# Patient Record
Sex: Male | Born: 1955 | Race: White | Hispanic: No | Smoking: Current every day smoker
Health system: Southern US, Community
[De-identification: ages and names within clinical notes are randomized; demographics above are authoritative.]

## PROBLEM LIST (undated history)

## (undated) DIAGNOSIS — K219 Gastro-esophageal reflux disease without esophagitis: Secondary | ICD-10-CM

## (undated) DIAGNOSIS — M5030 Other cervical disc degeneration, unspecified cervical region: Secondary | ICD-10-CM

## (undated) DIAGNOSIS — Z8711 Personal history of peptic ulcer disease: Secondary | ICD-10-CM

## (undated) DIAGNOSIS — K279 Peptic ulcer, site unspecified, unspecified as acute or chronic, without hemorrhage or perforation: Secondary | ICD-10-CM

## (undated) DIAGNOSIS — I219 Acute myocardial infarction, unspecified: Secondary | ICD-10-CM

## (undated) DIAGNOSIS — M1990 Unspecified osteoarthritis, unspecified site: Secondary | ICD-10-CM

## (undated) DIAGNOSIS — G8929 Other chronic pain: Secondary | ICD-10-CM

## (undated) DIAGNOSIS — Z8719 Personal history of other diseases of the digestive system: Secondary | ICD-10-CM

## (undated) DIAGNOSIS — J45909 Unspecified asthma, uncomplicated: Secondary | ICD-10-CM

## (undated) DIAGNOSIS — F419 Anxiety disorder, unspecified: Secondary | ICD-10-CM

## (undated) DIAGNOSIS — M549 Dorsalgia, unspecified: Secondary | ICD-10-CM

## (undated) HISTORY — PX: CHOLECYSTECTOMY: SHX55

## (undated) HISTORY — PX: ABDOMINAL SURGERY: SHX537

## (undated) HISTORY — PX: LEG SURGERY: SHX1003

---

## 2000-02-05 ENCOUNTER — Inpatient Hospital Stay (HOSPITAL_COMMUNITY): Admission: EM | Admit: 2000-02-05 | Discharge: 2000-02-05 | Payer: Self-pay | Admitting: *Deleted

## 2001-07-29 ENCOUNTER — Ambulatory Visit (HOSPITAL_COMMUNITY): Admission: RE | Admit: 2001-07-29 | Discharge: 2001-07-29 | Payer: Self-pay | Admitting: Cardiology

## 2002-04-20 ENCOUNTER — Ambulatory Visit (HOSPITAL_COMMUNITY): Admission: RE | Admit: 2002-04-20 | Discharge: 2002-04-20 | Payer: Self-pay | Admitting: Pulmonary Disease

## 2004-06-30 DIAGNOSIS — I219 Acute myocardial infarction, unspecified: Secondary | ICD-10-CM

## 2004-06-30 HISTORY — DX: Acute myocardial infarction, unspecified: I21.9

## 2004-11-27 ENCOUNTER — Ambulatory Visit: Payer: Self-pay | Admitting: *Deleted

## 2005-05-23 ENCOUNTER — Ambulatory Visit (HOSPITAL_COMMUNITY): Admission: RE | Admit: 2005-05-23 | Discharge: 2005-05-23 | Payer: Self-pay | Admitting: General Surgery

## 2005-10-30 ENCOUNTER — Ambulatory Visit (HOSPITAL_COMMUNITY): Admission: RE | Admit: 2005-10-30 | Discharge: 2005-10-30 | Payer: Self-pay | Admitting: General Surgery

## 2005-10-30 ENCOUNTER — Encounter (HOSPITAL_COMMUNITY): Admission: RE | Admit: 2005-10-30 | Discharge: 2005-11-29 | Payer: Self-pay | Admitting: General Surgery

## 2006-07-10 ENCOUNTER — Ambulatory Visit (HOSPITAL_COMMUNITY): Admission: RE | Admit: 2006-07-10 | Discharge: 2006-07-10 | Payer: Self-pay | Admitting: Family Medicine

## 2006-09-02 ENCOUNTER — Encounter (HOSPITAL_COMMUNITY)
Admission: RE | Admit: 2006-09-02 | Discharge: 2006-10-02 | Payer: Self-pay | Admitting: Physical Medicine and Rehabilitation

## 2007-12-04 ENCOUNTER — Emergency Department (HOSPITAL_COMMUNITY): Admission: EM | Admit: 2007-12-04 | Discharge: 2007-12-04 | Payer: Self-pay | Admitting: Emergency Medicine

## 2008-01-19 ENCOUNTER — Emergency Department (HOSPITAL_COMMUNITY): Admission: EM | Admit: 2008-01-19 | Discharge: 2008-01-19 | Payer: Self-pay | Admitting: Emergency Medicine

## 2008-03-15 ENCOUNTER — Ambulatory Visit (HOSPITAL_COMMUNITY): Admission: RE | Admit: 2008-03-15 | Discharge: 2008-03-15 | Payer: Self-pay | Admitting: Preventative Medicine

## 2008-08-21 ENCOUNTER — Encounter: Payer: Self-pay | Admitting: Orthopedic Surgery

## 2008-08-21 ENCOUNTER — Encounter: Admission: RE | Admit: 2008-08-21 | Discharge: 2008-08-21 | Payer: Self-pay | Admitting: Neurosurgery

## 2009-08-03 ENCOUNTER — Encounter: Payer: Self-pay | Admitting: Orthopedic Surgery

## 2009-08-04 ENCOUNTER — Ambulatory Visit (HOSPITAL_COMMUNITY): Admission: RE | Admit: 2009-08-04 | Discharge: 2009-08-04 | Payer: Self-pay | Admitting: Family Medicine

## 2009-09-18 ENCOUNTER — Ambulatory Visit: Payer: Self-pay | Admitting: Orthopedic Surgery

## 2009-09-18 DIAGNOSIS — M79609 Pain in unspecified limb: Secondary | ICD-10-CM | POA: Insufficient documentation

## 2009-09-18 DIAGNOSIS — M6530 Trigger finger, unspecified finger: Secondary | ICD-10-CM | POA: Insufficient documentation

## 2009-09-25 ENCOUNTER — Ambulatory Visit: Payer: Self-pay | Admitting: Orthopedic Surgery

## 2009-09-27 ENCOUNTER — Telehealth: Payer: Self-pay | Admitting: Orthopedic Surgery

## 2009-09-28 ENCOUNTER — Ambulatory Visit: Payer: Self-pay | Admitting: Orthopedic Surgery

## 2009-09-28 ENCOUNTER — Ambulatory Visit (HOSPITAL_COMMUNITY): Admission: RE | Admit: 2009-09-28 | Discharge: 2009-09-28 | Payer: Self-pay | Admitting: Orthopedic Surgery

## 2009-10-02 ENCOUNTER — Ambulatory Visit: Payer: Self-pay | Admitting: Orthopedic Surgery

## 2009-10-11 ENCOUNTER — Ambulatory Visit: Payer: Self-pay | Admitting: Orthopedic Surgery

## 2010-07-30 NOTE — Assessment & Plan Note (Signed)
Summary: 1 WK RE-CK LT HAND/MEDICARE,MEDICAID/CAF   Visit Type:  Follow-up Referring Provider:  Dr. Janna Arch Primary Provider:  Dr. Janna Arch  CC:  left thumb trigger.  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  The naproxen it didn't help and went to the ER x-rays were notable for arthritis of the small joints of the hand including the IP joints presents now for evaluation his pain is 8/10 he says this worsened over 3 months we injected his thumb. It didn't help at all. He still having a lot of pain and tenderness and catching.  Xrays Left hand 08/04/09.  Meds: Omeprazole, Xanax, Ventolin, Naproxen.   Allergies: 1)  ! Jonne Ply  Past History:  Past Medical History: Last updated: 09/18/2009 ulcers ddd anxiety  Past Surgical History: Last updated: 09/18/2009 ulcres gallbladder appendix infectious arthritis  Family History: Last updated: 09/18/2009 FH of Cancer:  Family History of Diabetes Family History Coronary Heart Disease male < 89 Family History of Arthritis Hx, family, chronic respiratory condition Hx, family, asthma  Social History: Last updated: 09/18/2009 Patient is divorced.  disabled 1/2 ppd cigs 3 beers a day 4 cups of coffee daily  Review of Systems       weight loss, fatigue, blurred vision, headache, denies chest pain, palpitations, shortness of breath, wheezing cough, snoring, heartburn, nausea, vomiting, diarrhea,  frequency, urgency, muscle pain, numbness, tingling, dizziness, nervousness anxiety.  other systems are negative  Physical Exam  Additional Exam:  VS reviewed are stable  GENERAL: Appearance is normal   CDV: normal pulse and temperature   Skin: was normal   Neuro: sensation was normal   MSK  LEFT hand  Inspection swelling and flexion posture LEFT thumb with IP joint in flexion.  Tenderness over A1 pulley.  Passive range of motion I  was able to pop the digit back into extension it was painful his flexion power is normal there is no joint laxity    Impression & Recommendations:  Problem # 1:  THUMB PAIN (ICD-729.5) Assessment Unchanged  Orders: Est. Patient Level IV (60630)  Problem # 2:  TRIGGER FINGER, LEFT THUMB (ICD-727.03) Assessment: Unchanged  Orders: Est. Patient Level IV (16010)  Patient Instructions: 1)  Left thumb trigger release and nodule excision  2)  Set up post op appointment for Tues

## 2010-07-30 NOTE — Progress Notes (Signed)
Summary: No pre-cert required out-patient surgery  Phone Note Outgoing Call   Call placed to: Insurer Summary of Call: No pre-cert required for out-patient procedure scheduled 09/28/09 at [facility] (Medicare primary, Medicaid secondary) Initial call taken by: Cammie Sickle,  September 27, 2009 7:17 PM

## 2010-07-30 NOTE — Letter (Signed)
Summary: History form  History form   Imported By: Jacklynn Ganong 09/25/2009 14:16:18  _____________________________________________________________________  External Attachment:    Type:   Image     Comment:   External Document

## 2010-07-30 NOTE — Assessment & Plan Note (Signed)
Summary: POST OP 1/LT THUMB/SURG 09/28/09/MEDICARE,MEDICAID/CAF   Visit Type:  Follow-up Referring Provider:  Dr. Janna Arch Primary Provider:  Dr. Janna Arch  CC:  post op 1 left thumb trigger.  History of Present Illness: I saw Vincent Hudson in the office today for a followup visit.  [name] a 55 years old man with the complaint of:  DOS 09/28/09 left thumb  Procedure Release of left thumb trigger finger.  Medication Norco 5 only as needed  Doing good, can move finger better, no locking.  Subjectives post op 1 check incision.  POD 4.  incision looks good. His flexion, extension, is normal.  Take stitches out next visit     Allergies: 1)  ! Asa   Impression & Recommendations:  Problem # 1:  AFTERCARE FOLLOW SURGERY SKIN&SUBCUT TISSUE NEC (ICD-V58.77) Assessment Comment Only  stable   Orders: Post-Op Check (56213)  Patient Instructions: 1)  Come back 10/11/09 for suture remval post op 2

## 2010-07-30 NOTE — Assessment & Plan Note (Signed)
Summary: left hand/finger pain xr ap feb'11/med/medicaid/bsf   Vital Signs:  Patient profile:   55 year old male Height:      70 inches Weight:      186 pounds Pulse rate:   64 / minute Resp:     16 per minute  Vitals Entered By: Fuller Canada MD (September 18, 2009 1:10 PM)  Visit Type:  Initial Consult Referring Provider:  Dr. Janna Arch Primary Provider:  Dr. Janna Arch  CC:  left hand.  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  The naproxen it didn't help and went to the ER x-rays were notable for arthritis of the small joints of the hand including the IP joints presents now for evaluation his pain is 8/10 he says this worsened over 3 months  Xrays Left hand 08/04/09.  Meds: Omeprazole, Xanax, Ventolin, Naproxen.   Allergies (verified): 1)  ! Jonne Ply  Past History:  Past Medical History: ulcers ddd anxiety  Past Surgical History: ulcres gallbladder appendix infectious arthritis  Family History: FH of Cancer:  Family History of Diabetes Family History Coronary Heart Disease male < 42 Family History of Arthritis Hx, family, chronic respiratory condition Hx, family, asthma  Social History: Patient is divorced.  disabled 1/2 ppd cigs 3 beers a day 4 cups of coffee daily  Review of Systems Constitutional:  Complains of weight loss and fatigue; denies weight gain, fever, and chills. Cardiovascular:  Complains of chest pain and palpitations; denies fainting and murmurs. Respiratory:  Complains of short of breath, wheezing, couch, and snoring; denies tightness, pain on inspiration, and snoring . Gastrointestinal:  Complains of heartburn, nausea, vomiting, diarrhea, and blood in your stools; denies constipation. Genitourinary:  Complains of frequency and urgency; denies difficulty urinating, painful urination, flank pain, and bleeding in urine. Neurologic:  Complains of  numbness, tingling, and dizziness; denies unsteady gait, tremors, and seizure. Musculoskeletal:  Complains of joint pain, swelling, stiffness, and muscle pain; denies instability, redness, and heat. Endocrine:  Denies excessive thirst, exessive urination, and heat or cold intolerance. Psychiatric:  Complains of nervousness and anxiety; denies depression and hallucinations. Skin:  Denies changes in the skin, poor healing, rash, itching, and redness. HEENT:  Complains of blurred or double vision and watering; denies eye pain and redness; headache and ears ring. Immunology:  Denies seasonal allergies, sinus problems, and allergic to bee stings. Hemoatologic:  Denies easy bleeding and brusing.  Physical Exam  Additional Exam:  VS reviewed are stable  GENERAL: Appearance is normal   CDV: normal pulse and temperature   Skin: was normal   Neuro: sensation was normal   MSK  LEFT hand  Inspection swelling and flexion posture LEFT thumb with IP joint in flexion.  Tenderness over A1 pulley.  Passive range of motion I was able to pop the digit back into extension it was painful his flexion power is normal there is no joint laxity    Impression & Recommendations:  Problem # 1:  TRIGGER FINGER, LEFT THUMB (ICD-727.03) Assessment New  injected LEFT trigger thumb Verbal consent was obtained: The finger was prepped with ethyl chloride and injected with 1:1 injection of .25% sensorcaine, 1cc  and 40 mg of depomedrol, 1cc. There were no complications.  I also applied a splint this seems to be a very severe case recommend followup in a week or a splint for a week if not better recommend surgery  Orders: New Patient Level III (16109) Joint Aspirate /  Injection, Small (82993) Depo- Medrol 40mg  (J1030)  Patient Instructions: 1)  You have received an injection of cortisone today. You may experience increased pain at the injection site. Apply ice pack to the area for 20 minutes every 2 hours and  take 2 xtra strength tylenol every 8 hours. This increased pain will usually resolve in 24 hours. The injection will take effect in 3-10 days.  2)  return in 1 week  Appended Document: left hand/finger pain xr ap feb'11/med/medicaid/bsf I reviewed the x-ray from the hospital and I also reviewed the report has arthritis in his hands.

## 2010-07-30 NOTE — Assessment & Plan Note (Signed)
Summary: POST OP 2/SUTURE REM/SURG 09/28/09/MEDICARE,MEDIC/CAF   Visit Type:  Follow-up Referring Provider:  Dr. Janna Arch Primary Provider:  Dr. Janna Arch  CC:  POST OP TRIGGER RELEASE.  History of Present Illness: I saw Vincent Hudson in the office today for a followup visit.  [name] a 55 years old man with the complaint of:  DOS 09/28/09 left thumb  POD 13  Procedure Release of left thumb trigger finger.  Medication Norco 5 only as needed  Doing good, can move finger better, no locking.  Today  stitch removal.  Doing well.  normal wound   d/c    Allergies: 1)  ! Asa   Other Orders: Post-Op Check [phone])  Patient Instructions: 1)  Please schedule a follow-up appointment as needed.

## 2010-09-22 LAB — HEMOGLOBIN AND HEMATOCRIT, BLOOD: HCT: 42.8 % (ref 39.0–52.0)

## 2010-09-22 LAB — BASIC METABOLIC PANEL
BUN: 10 mg/dL (ref 6–23)
CO2: 30 mEq/L (ref 19–32)
Creatinine, Ser: 1.01 mg/dL (ref 0.4–1.5)
GFR calc Af Amer: >60 mL/min (ref >60)
Glucose, Bld: 113 mg/dL — ABNORMAL HIGH (ref 70–99)
Potassium: 4.9 mEq/L (ref 3.5–5.1)

## 2010-11-04 ENCOUNTER — Other Ambulatory Visit (HOSPITAL_COMMUNITY)
Admission: RE | Admit: 2010-11-04 | Discharge: 2010-11-04 | Disposition: A | Discharge status: Home / Self Care | Payer: Medicare Other | Source: Ambulatory Visit | Attending: General Surgery | Admitting: General Surgery

## 2010-11-04 ENCOUNTER — Other Ambulatory Visit: Payer: Self-pay | Admitting: General Surgery

## 2010-11-04 DIAGNOSIS — T63481A Toxic effect of venom of other arthropod, accidental (unintentional), initial encounter: Secondary | ICD-10-CM | POA: Insufficient documentation

## 2010-11-04 DIAGNOSIS — L980 Pyogenic granuloma: Secondary | ICD-10-CM | POA: Insufficient documentation

## 2010-11-04 DIAGNOSIS — L989 Disorder of the skin and subcutaneous tissue, unspecified: Secondary | ICD-10-CM | POA: Insufficient documentation

## 2010-11-20 NOTE — Op Note (Signed)
  NAMESHANN, Vincent             ACCOUNT NO.:  [id number]  MEDICAL RECORD NO.:  [id number]           PATIENT TYPE:  LOCATION:                                 FACILITY:  PHYSICIAN:  Barbaraann Barthel, M.D. DATE OF BIRTH:  Nov 23, 1955  DATE OF PROCEDURE: DATE OF DISCHARGE:                              OPERATIVE REPORT   ADDRESS:  Melvyn Novas, MD [address] [place], [place] [address]  BODY:  Dear [name]:  Thank you kindly for sending Vincent Hudson my way.  As you know, he gave a history of being bitten by a tick approximately a month ago in his left axilla.  When he came to my office, there was a granuloma or an erythematous nodule in his left axilla and the tick apparently spontaneously fell off. He states that neither you nor he removed it. At any rate, I could not be sure that there is no head here, so under local anesthesia I removed the nodule and we will send it to pathology to see if they can confirm the presence of any tick.  At any rate, this was done under local anesthesia without complications and I will make sure you get a copy of the pathology report for your records.  I urged him to continue to taking the doxycycline you ordered.  Thanks again.  Sincerely,     Barbaraann Barthel, M.D.     WB/MEDQ  D:  11/04/2010  T:  11/05/2010  Job:  [id number]  Electronically Signed by Barbaraann Barthel M.D. on 11/20/2010 03:39:50 PM

## 2011-01-23 ENCOUNTER — Other Ambulatory Visit (HOSPITAL_COMMUNITY): Payer: Self-pay | Admitting: Family Medicine

## 2011-01-23 DIAGNOSIS — M5410 Radiculopathy, site unspecified: Secondary | ICD-10-CM

## 2011-01-23 DIAGNOSIS — R52 Pain, unspecified: Secondary | ICD-10-CM

## 2011-01-24 ENCOUNTER — Ambulatory Visit (HOSPITAL_COMMUNITY)
Admission: RE | Admit: 2011-01-24 | Discharge: 2011-01-24 | Disposition: A | Discharge status: Home / Self Care | Payer: Medicare Other | Source: Ambulatory Visit | Attending: Family Medicine | Admitting: Family Medicine

## 2011-01-24 DIAGNOSIS — M545 Low back pain, unspecified: Secondary | ICD-10-CM | POA: Insufficient documentation

## 2011-01-24 DIAGNOSIS — M25559 Pain in unspecified hip: Secondary | ICD-10-CM | POA: Insufficient documentation

## 2011-01-24 DIAGNOSIS — M5410 Radiculopathy, site unspecified: Secondary | ICD-10-CM

## 2011-01-24 DIAGNOSIS — R209 Unspecified disturbances of skin sensation: Secondary | ICD-10-CM | POA: Insufficient documentation

## 2011-01-24 DIAGNOSIS — M5126 Other intervertebral disc displacement, lumbar region: Secondary | ICD-10-CM | POA: Insufficient documentation

## 2011-01-24 DIAGNOSIS — R52 Pain, unspecified: Secondary | ICD-10-CM

## 2011-03-27 LAB — DIFFERENTIAL
Basophils Absolute: 0
Basophils Relative: 0
Eosinophils Relative: 3
Lymphs Abs: 2.1
Monocytes Absolute: 0.4

## 2011-03-27 LAB — LIPASE, BLOOD: Lipase: 21

## 2011-03-27 LAB — CBC
HCT: 40.9
Platelets: 244
RBC: 4.23
WBC: 7.4

## 2011-03-27 LAB — COMPREHENSIVE METABOLIC PANEL
ALT: 42
Albumin: 3.7
BUN: 6
Calcium: 8.8
Chloride: 103
Creatinine, Ser: 0.82
GFR calc Af Amer: >60
GFR calc non Af Amer: >60
Glucose, Bld: 105 — ABNORMAL HIGH
Sodium: 138
Total Protein: 7.1

## 2011-03-28 LAB — DIFFERENTIAL
Basophils Absolute: 0
Eosinophils Absolute: 0.1
Lymphs Abs: 2.8
Monocytes Absolute: 0.5
Neutro Abs: 4.1

## 2011-03-28 LAB — CBC
HCT: 42.5
Hemoglobin: 14.7
MCV: 98.3
Platelets: 258
WBC: 7.6

## 2011-03-28 LAB — BASIC METABOLIC PANEL
CO2: 24
Calcium: 8.7
GFR calc non Af Amer: >60
Glucose, Bld: 97
Potassium: 3.8

## 2011-03-28 LAB — RAPID URINE DRUG SCREEN, HOSP PERFORMED
Benzodiazepines: NOT DETECTED
Tetrahydrocannabinol: NOT DETECTED

## 2012-02-13 ENCOUNTER — Encounter (HOSPITAL_COMMUNITY): Payer: Self-pay | Admitting: Emergency Medicine

## 2012-02-13 ENCOUNTER — Emergency Department (HOSPITAL_COMMUNITY)
Admission: EM | Admit: 2012-02-13 | Discharge: 2012-02-13 | Disposition: A | Discharge status: Home / Self Care | Payer: Medicare Other | Attending: Emergency Medicine | Admitting: Emergency Medicine

## 2012-02-13 ENCOUNTER — Emergency Department (HOSPITAL_COMMUNITY): Payer: Medicare Other

## 2012-02-13 DIAGNOSIS — F17200 Nicotine dependence, unspecified, uncomplicated: Secondary | ICD-10-CM | POA: Insufficient documentation

## 2012-02-13 DIAGNOSIS — R109 Unspecified abdominal pain: Secondary | ICD-10-CM | POA: Insufficient documentation

## 2012-02-13 DIAGNOSIS — Z8739 Personal history of other diseases of the musculoskeletal system and connective tissue: Secondary | ICD-10-CM | POA: Insufficient documentation

## 2012-02-13 DIAGNOSIS — G8929 Other chronic pain: Secondary | ICD-10-CM | POA: Insufficient documentation

## 2012-02-13 DIAGNOSIS — R079 Chest pain, unspecified: Secondary | ICD-10-CM | POA: Insufficient documentation

## 2012-02-13 DIAGNOSIS — Z79899 Other long term (current) drug therapy: Secondary | ICD-10-CM | POA: Insufficient documentation

## 2012-02-13 DIAGNOSIS — N390 Urinary tract infection, site not specified: Secondary | ICD-10-CM | POA: Insufficient documentation

## 2012-02-13 HISTORY — DX: Dorsalgia, unspecified: M54.9

## 2012-02-13 HISTORY — DX: Other chronic pain: G89.29

## 2012-02-13 HISTORY — DX: Peptic ulcer, site unspecified, unspecified as acute or chronic, without hemorrhage or perforation: K27.9

## 2012-02-13 HISTORY — DX: Personal history of peptic ulcer disease: Z87.11

## 2012-02-13 HISTORY — DX: Personal history of other diseases of the digestive system: Z87.19

## 2012-02-13 HISTORY — DX: Unspecified osteoarthritis, unspecified site: M19.90

## 2012-02-13 LAB — URINALYSIS, ROUTINE W REFLEX MICROSCOPIC
Leukocytes, UA: NEGATIVE
Nitrite: NEGATIVE
Specific Gravity, Urine: 1.02 (ref 1.005–1.030)
pH: 6 (ref 5.0–8.0)

## 2012-02-13 LAB — TROPONIN I: Troponin I: <0.3 ng/mL (ref <0.30)

## 2012-02-13 LAB — URINE MICROSCOPIC-ADD ON

## 2012-02-13 LAB — CBC WITH DIFFERENTIAL/PLATELET
Eosinophils Absolute: 0 10*3/uL (ref 0.0–0.7)
Lymphocytes Relative: 11 % — ABNORMAL LOW (ref 12–46)
Lymphs Abs: 1.7 10*3/uL (ref 0.7–4.0)
Neutro Abs: 12.6 10*3/uL — ABNORMAL HIGH (ref 1.7–7.7)
Neutrophils Relative %: 78 % — ABNORMAL HIGH (ref 43–77)
Platelets: 256 10*3/uL (ref 150–400)
RBC: 4.6 MIL/uL (ref 4.22–5.81)
WBC: 16.1 10*3/uL — ABNORMAL HIGH (ref 4.0–10.5)

## 2012-02-13 LAB — COMPREHENSIVE METABOLIC PANEL
ALT: 48 U/L (ref 0–53)
Alkaline Phosphatase: 103 U/L (ref 39–117)
Chloride: 96 mEq/L (ref 96–112)
GFR calc Af Amer: >90 mL/min (ref >90)
Glucose, Bld: 104 mg/dL — ABNORMAL HIGH (ref 70–99)
Potassium: 3.7 mEq/L (ref 3.5–5.1)
Sodium: 135 mEq/L (ref 135–145)
Total Bilirubin: 0.9 mg/dL (ref 0.3–1.2)
Total Protein: 7.5 g/dL (ref 6.0–8.3)

## 2012-02-13 MED ORDER — FAMOTIDINE IN NACL 20-0.9 MG/50ML-% IV SOLN
20.0000 mg | Freq: Once | INTRAVENOUS | Status: AC
  Administered 2012-02-13: 20 mg via INTRAVENOUS
  Filled 2012-02-13: qty 50

## 2012-02-13 MED ORDER — ONDANSETRON HCL 4 MG/2ML IJ SOLN
4.0000 mg | INTRAMUSCULAR | Status: AC | PRN
  Administered 2012-02-13 (×2): 4 mg via INTRAVENOUS
  Filled 2012-02-13 (×2): qty 2

## 2012-02-13 MED ORDER — OXYCODONE-ACETAMINOPHEN 5-325 MG PO TABS: ORAL_TABLET | ORAL | Status: AC

## 2012-02-13 MED ORDER — DEXTROSE 5 % IV SOLN
1.0000 g | Freq: Once | INTRAVENOUS | Status: AC
  Administered 2012-02-13: 1 g via INTRAVENOUS
  Filled 2012-02-13: qty 10

## 2012-02-13 MED ORDER — CEPHALEXIN 500 MG PO CAPS: 500.0000 mg | ORAL_CAPSULE | Freq: Four times a day (QID) | ORAL | Status: AC

## 2012-02-13 MED ORDER — MORPHINE SULFATE 4 MG/ML IJ SOLN
4.0000 mg | INTRAMUSCULAR | Status: AC | PRN
  Administered 2012-02-13 (×2): 4 mg via INTRAVENOUS
  Filled 2012-02-13 (×2): qty 1

## 2012-02-13 MED ORDER — IOHEXOL 300 MG/ML  SOLN
100.0000 mL | Freq: Once | INTRAMUSCULAR | Status: AC | PRN
  Administered 2012-02-13: 100 mL via INTRAVENOUS

## 2012-02-13 MED ORDER — ONDANSETRON HCL 4 MG PO TABS: 4.0000 mg | ORAL_TABLET | Freq: Three times a day (TID) | ORAL | Status: AC | PRN

## 2012-02-13 MED ORDER — SODIUM CHLORIDE 0.9 % IV SOLN
INTRAVENOUS | Status: DC
  Administered 2012-02-13: 14:00:00 via INTRAVENOUS

## 2012-02-13 NOTE — ED Provider Notes (Signed)
History   This chart was scribed for Laray Anger, DO by Toya Smothers. The patient was seen in room APA02/APA02. Patient's care was started at 1211.  CSN: [id number]  Arrival date & time 02/13/12  1211   First MD Initiated Contact with Patient 02/13/12 1231      Chief Complaint  Patient presents with  . Chest Pain  . Abdominal Pain  . Nausea   HPI Pt seen at 1248. Vincent Hudson is a 56 y.o. male who presents to the Emergency Department complaining of gradual onset and persistence of constant upper abdominal "pain" for the past 4-5 days. Pain is described as constant and diffuse.  Pt states the pain worsens with movement, causing a sensation of "something feels loose in there."  Has been associated with nausea and radiation into his lower chest.  Denying fever, no vomiting/diarrhea, no back pain, no black or blood in stools or emesis, no SOB/cough.    PCP: Dr. Delbert Harness Past Medical History  Diagnosis Date  . History of stomach ulcers   . Arthritis   . Chronic back pain   . PUD (peptic ulcer disease)     Past Surgical History  Procedure Date  . Abdominal surgery     "for ulcers"  . Cholecystectomy     History  Substance Use Topics  . Smoking status: Current Everyday Smoker    Types: Cigarettes  . Smokeless tobacco: Not on file  . Alcohol Use: No    Review of Systems ROS: Statement: All systems negative except as marked or noted in the HPI; Constitutional: Negative for fever and chills. ; ; Eyes: Negative for eye pain, redness and discharge. ; ; ENMT: Negative for ear pain, hoarseness, nasal congestion, sinus pressure and sore throat. ; ; Cardiovascular: Negative for chest pain, palpitations, diaphoresis, dyspnea and peripheral edema. ; ; Respiratory: Negative for cough, wheezing and stridor. ; ; Gastrointestinal: +nausea, abd pain. Negative for vomiting, diarrhea, blood in stool, hematemesis, jaundice and rectal bleeding. . ; ; Genitourinary: Negative for dysuria,  flank pain and hematuria. ; ; Musculoskeletal: Negative for back pain and neck pain. Negative for swelling and trauma.; ; Skin: Negative for pruritus, rash, abrasions, blisters, bruising and skin lesion.; ; Neuro: Negative for headache, lightheadedness and neck stiffness. Negative for weakness, altered level of consciousness , altered mental status, extremity weakness, paresthesias, involuntary movement, seizure and syncope.     Allergies  Aspirin and Nsaids  Home Medications   Current Outpatient Rx  Name Route Sig Dispense Refill  . ALBUTEROL SULFATE HFA 108 (90 BASE) MCG/ACT IN AERS Inhalation Inhale 2 puffs into the lungs every 6 (six) hours as needed. For shortness of breath    . ALBUTEROL SULFATE (2.5 MG/3ML) 0.083% IN NEBU Nebulization Take 2.5 mg by nebulization daily as needed. For shortness of breath    . ALPRAZOLAM 0.5 MG PO TABS Oral Take 0.5 mg by mouth 3 (three) times daily as needed. For anxiety and nervous stomach    . VITAMIN C PO Oral Take 1 tablet by mouth daily. Unknown strength    . VITAMIN B-12 PO Oral Take 1 tablet by mouth daily. Unknown strength    . OMEPRAZOLE 20 MG PO CPDR Oral Take 20 mg by mouth 2 (two) times daily.    . OXYCODONE-ACETAMINOPHEN 5-500 MG PO CAPS Oral Take 1 capsule by mouth every 4 (four) hours as needed. For back pain      BP 134/67  Pulse 87  Temp 98.2  F (36.8 C) (Oral)  Resp 25  Ht 5\' 11"  (1.803 m)  Wt 201 lb (91.173 kg)  BMI 28.03 kg/m2  SpO2 93%  Physical Exam 1250: Physical examination:  Nursing notes reviewed; Vital signs and O2 SAT reviewed;  Constitutional: Well developed, Well nourished, Well hydrated, In no acute distress; Head:  Normocephalic, atraumatic; Eyes: EOMI, PERRL, No scleral icterus; ENMT: Mouth and pharynx normal, Mucous membranes moist; Neck: Supple, Full range of motion, No lymphadenopathy; Cardiovascular: Regular rate and rhythm, No gallop; Respiratory: Breath sounds clear & equal bilaterally, No wheezes.  Speaking  full sentences with ease, Normal respiratory effort/excursion; Chest: Nontender, Movement normal; Abdomen: Soft, +epigastric tenderness to palp. No rebound or guarding. Nondistended, Normal bowel sounds;; Extremities: Pulses normal, No tenderness, No edema, No calf edema or asymmetry.; Neuro: AA&Ox3, Major CN grossly intact.  Speech clear. No gross focal motor or sensory deficits in extremities.; Skin: Color normal, Warm, Dry.   ED Course  Procedures    MDM  MDM Reviewed: nursing note, vitals and previous chart Reviewed previous: ECG Interpretation: labs, CT scan, x-ray and ECG    Date: 02/13/2012  Rate: 85  Rhythm: normal sinus rhythm  QRS Axis: normal  Intervals: normal  ST/T Wave abnormalities: normal  Conduction Disutrbances:none  Narrative Interpretation:   Old EKG Reviewed: unchanged; no significant changes from previous EKG dated 09/26/2009.  Results for orders placed during the hospital encounter of 02/13/12  CBC WITH DIFFERENTIAL      Component Value Range   WBC 16.1 (*) 4.0 - 10.5 K/uL   RBC 4.60  4.22 - 5.81 MIL/uL   Hemoglobin 15.6  13.0 - 17.0 g/dL   HCT 09.8  11.9 - 14.7 %   MCV 95.4  78.0 - 100.0 fL   MCH 33.9  26.0 - 34.0 pg   MCHC 35.5  30.0 - 36.0 g/dL   RDW 82.9  56.2 - 13.0 %   Platelets 256  150 - 400 K/uL   Neutrophils Relative 78 (*) 43 - 77 %   Neutro Abs 12.6 (*) 1.7 - 7.7 K/uL   Lymphocytes Relative 11 (*) 12 - 46 %   Lymphs Abs 1.7  0.7 - 4.0 K/uL   Monocytes Relative 11  3 - 12 %   Monocytes Absolute 1.8 (*) 0.1 - 1.0 K/uL   Eosinophils Relative 0  0 - 5 %   Eosinophils Absolute 0.0  0.0 - 0.7 K/uL   Basophils Relative 0  0 - 1 %   Basophils Absolute 0.0  0.0 - 0.1 K/uL  COMPREHENSIVE METABOLIC PANEL      Component Value Range   Sodium 135  135 - 145 mEq/L   Potassium 3.7  3.5 - 5.1 mEq/L   Chloride 96  96 - 112 mEq/L   CO2 27  19 - 32 mEq/L   Glucose, Bld 104 (*) 70 - 99 mg/dL   BUN 8  6 - 23 mg/dL   Creatinine, Ser 8.65  0.50 - 1.35  mg/dL   Calcium 9.2  8.4 - 78.4 mg/dL   Total Protein 7.5  6.0 - 8.3 g/dL   Albumin 3.7  3.5 - 5.2 g/dL   AST 50 (*) 0 - 37 U/L   ALT 48  0 - 53 U/L   Alkaline Phosphatase 103  39 - 117 U/L   Total Bilirubin 0.9  0.3 - 1.2 mg/dL   GFR calc non Af Amer >90  >90 mL/min   GFR calc Af Amer >90  >  90 mL/min  LIPASE, BLOOD      Component Value Range   Lipase 12  11 - 59 U/L  LACTIC ACID, PLASMA      Component Value Range   Lactic Acid, Venous 0.8  0.5 - 2.2 mmol/L  PROCALCITONIN      Component Value Range   Procalcitonin 1.37    TROPONIN I      Component Value Range   Troponin I <0.30  <0.30 ng/mL  URINALYSIS, ROUTINE W REFLEX MICROSCOPIC      Component Value Range   Color, Urine YELLOW  YELLOW   APPearance CLEAR  CLEAR   Specific Gravity, Urine 1.020  1.005 - 1.030   pH 6.0  5.0 - 8.0   Glucose, UA NEGATIVE  NEGATIVE mg/dL   Hgb urine dipstick LARGE (*) NEGATIVE   Bilirubin Urine SMALL (*) NEGATIVE   Ketones, ur 40 (*) NEGATIVE mg/dL   Protein, ur NEGATIVE  NEGATIVE mg/dL   Urobilinogen, UA 1.0  0.0 - 1.0 mg/dL   Nitrite NEGATIVE  NEGATIVE   Leukocytes, UA NEGATIVE  NEGATIVE  URINE MICROSCOPIC-ADD ON      Component Value Range   WBC, UA 0-2  <3 WBC/hpf   RBC / HPF 0-2  <3 RBC/hpf   Bacteria, UA FEW (*) RARE   Urine-Other MUCOUS PRESENT     Dg Chest 2 View 02/13/2012  *RADIOLOGY REPORT*  Clinical Data: Cough, chest heaviness, smoking history  CHEST - 2 VIEW  Comparison: Chest x-ray of 09/28/2009  Findings: There are linear markings at both lung bases more prominently at the left lung base.  This may be due to atelectasis, but follow-up chest x-ray is recommended. The lungs remain slightly hyperaerated.  The nodular opacity previously noted in the left suprahilar region may represent overlapping vasculature and is stable.  Mediastinal contours are stable.  The heart is mildly enlarged and stable.  No acute bony abnormality is seen, with old left rib fractures again noted.   IMPRESSION:  1.  Probable linear atelectasis at the lung bases.  Consider follow- up. 2.  Slight hyperaeration.  Original Report Authenticated By: Juline Patch, M.D.   Ct Abdomen Pelvis W Contrast 02/13/2012  *RADIOLOGY REPORT*  Clinical Data: Chest pain, abdomen pain, nausea, vomiting, diarrhea, fever  CT ABDOMEN AND PELVIS WITH CONTRAST  Technique:  Multidetector CT imaging of the abdomen and pelvis was performed following the standard protocol during bolus administration of intravenous contrast.  Contrast: OMNIPAQUE IOHEXOL 300 MG/ML  SOLN  Comparison:  None  Findings: Mild atelectasis is noted posteriorly the lung bases. The liver is unremarkable in the unenhanced state.  Some contrast is noted in the slightly prominent distal esophagus which could suggest reflux.  The liver enhances with no focal abnormality and no ductal dilatation is seen. Surgical clips are present from prior cholecystectomy.  The pancreas is normal in size and the pancreatic duct is not dilated. Right adrenal gland is normal in size.  A probable left adrenal adenoma is present of approximately 16 mm in diameter.  The spleen is normal in size.  The stomach is not well distended and surgical suture line is noted along the lesser curvature of the stomach.  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  The kidneys enhance with no calculus or mass and no hydronephrosis is seen.  The abdominal aorta is normal in caliber.  The mesenteric vasculature appears patent.  No adenopathy is seen with only a few small  retroperitoneal nodes present.  The urinary bladder is unremarkable although slightly thick-walled. The prostate is minimally prominent.  There may be a degree of bladder outlet obstruction present.  No abnormality of the colon is seen and the terminal ileum and the appendix are well seen and appear normal.  No fluid is noted within the pelvis.  No bony abnormality is seen. Fat enters the inguinal canals, left  greater than right.  IMPRESSION:  1.  No explanation for the patient's pain is seen. 2.  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 7.  Three slightly thick-walled urinary bladder.  Question a degree of bladder outlet obstruction.  Original Report Authenticated By: Juline Patch, M.D.     1600:  Has to PO well while in the ED without N/V.  No stooling while in the ED.  VS remain stable, resps easy.  Possible UTI given Udip and slightly thick-walled urinary bladder on CT scan.  UC is pending.  Will tx with IV rocephin and rx keflex.  Non-specific elevation of procalcitonin, normal lactic acid, no fever, stable SBP; doubt sepsis at this time. Doubt ACS as cause for pain given constant pain x4 days with normal troponin and unchanged EKG from previous.  Wants to go home now.  Dx testing d/w pt.  Questions answered.  Verb understanding, agreeable to d/c home with outpt f/u.        I personally performed the services described in this documentation, which was scribed in my presence. The recorded information has been reviewed and considered. Merlyn Conley Allison Quarry, DO 02/15/12 Aldona Lento

## 2012-02-13 NOTE — ED Notes (Signed)
Pt c/o chest pressure and severe abd pain since Monday.

## 2012-02-13 NOTE — ED Notes (Signed)
Reports onset of upper abd pain and chest pain 4 days ago; reports that chest pain has subsided, but that abd pain is constant, sharp and severe. Denies vomiting/diarrhea, but c/o nausea; states pain is worse with movement. States last normal BM was 3 days ago. Reports unable to eat/drink d/t abd pain-states eating drinking make pain worse.  Pt is guarding abd and is tender to palpation LUQ.

## 2012-02-13 NOTE — ED Notes (Signed)
Given soda and peanut butter crackers for po challenge.

## 2012-02-13 NOTE — ED Notes (Signed)
Pt tolerating soda and crackers-reports pain, but denies nausea.

## 2012-02-13 NOTE — ED Notes (Signed)
Instructions/prescriptions reviewed and f/u information provided; verbalizes understanding; a&ox4; in no apparent distress.

## 2012-02-15 LAB — URINE CULTURE: Colony Count: 15000

## 2012-07-13 ENCOUNTER — Encounter (HOSPITAL_COMMUNITY): Payer: Self-pay | Admitting: *Deleted

## 2012-07-13 ENCOUNTER — Emergency Department (HOSPITAL_COMMUNITY)
Admission: EM | Admit: 2012-07-13 | Discharge: 2012-07-13 | Disposition: A | Discharge status: Home / Self Care | Payer: Medicare Other | Attending: Emergency Medicine | Admitting: Emergency Medicine

## 2012-07-13 DIAGNOSIS — Z8739 Personal history of other diseases of the musculoskeletal system and connective tissue: Secondary | ICD-10-CM | POA: Insufficient documentation

## 2012-07-13 DIAGNOSIS — M5430 Sciatica, unspecified side: Secondary | ICD-10-CM | POA: Insufficient documentation

## 2012-07-13 DIAGNOSIS — Z79899 Other long term (current) drug therapy: Secondary | ICD-10-CM | POA: Insufficient documentation

## 2012-07-13 DIAGNOSIS — M722 Plantar fascial fibromatosis: Secondary | ICD-10-CM | POA: Insufficient documentation

## 2012-07-13 DIAGNOSIS — G8929 Other chronic pain: Secondary | ICD-10-CM | POA: Insufficient documentation

## 2012-07-13 DIAGNOSIS — M5431 Sciatica, right side: Secondary | ICD-10-CM

## 2012-07-13 DIAGNOSIS — F17200 Nicotine dependence, unspecified, uncomplicated: Secondary | ICD-10-CM | POA: Insufficient documentation

## 2012-07-13 DIAGNOSIS — Z8711 Personal history of peptic ulcer disease: Secondary | ICD-10-CM | POA: Insufficient documentation

## 2012-07-13 DIAGNOSIS — M545 Low back pain: Secondary | ICD-10-CM

## 2012-07-13 MED ORDER — HYDROCODONE-ACETAMINOPHEN 5-325 MG PO TABS: 1.0000 | ORAL_TABLET | Freq: Four times a day (QID) | ORAL | Status: AC | PRN

## 2012-07-13 MED ORDER — PREDNISONE 50 MG PO TABS: 50.0000 mg | ORAL_TABLET | Freq: Every day | ORAL | Status: DC

## 2012-07-13 MED ORDER — HYDROCODONE-ACETAMINOPHEN 5-325 MG PO TABS
1.0000 | ORAL_TABLET | Freq: Once | ORAL | Status: AC
  Administered 2012-07-13: 1 via ORAL
  Filled 2012-07-13: qty 1

## 2012-07-13 MED ORDER — PREDNISONE 50 MG PO TABS
60.0000 mg | ORAL_TABLET | Freq: Once | ORAL | Status: AC
  Administered 2012-07-13: 60 mg via ORAL
  Filled 2012-07-13: qty 1

## 2012-07-13 NOTE — ED Notes (Signed)
Back pain, bil foot pain , hips.

## 2012-07-13 NOTE — ED Provider Notes (Signed)
History     CSN: [id number]  Arrival date & time 07/13/12  1237   First MD Initiated Contact with Patient 07/13/12 1321      Chief Complaint  Patient presents with  . Back Pain    (Consider location/radiation/quality/duration/timing/severity/associated sxs/prior treatment) HPI Comments: States he has seen dr. Janna Hudson several times with this complaint.  "he won't refer me to anybody".  Pt states he had surgery last year by dr. Romeo Hudson.  He describes R sciatica also.  Has pain in B feet but worse in R than L.  Patient is a 57 y.o. male presenting with back pain. The history is provided by the patient. No language interpreter was used.  Back Pain  This is a chronic problem. The problem occurs constantly. The problem has been gradually worsening. The symptoms are aggravated by bending and twisting. Pertinent negatives include no fever, no numbness and no weakness. He has tried nothing for the symptoms.    Past Medical History  Diagnosis Date  . History of stomach ulcers   . Arthritis   . Chronic back pain   . PUD (peptic ulcer disease)     Past Surgical History  Procedure Date  . Cholecystectomy   . Abdominal surgery     "for ulcers"  . Leg surgery     History reviewed. No pertinent family history.  History  Substance Use Topics  . Smoking status: Current Every Day Smoker    Types: Cigarettes  . Smokeless tobacco: Not on file  . Alcohol Use: Yes      Review of Systems  Constitutional: Negative for fever and chills.  Musculoskeletal: Positive for back pain.  Neurological: Negative for weakness and numbness.  All other systems reviewed and are negative.    Allergies  Aspirin and Nsaids  Home Medications   Current Outpatient Rx  Name  Route  Sig  Dispense  Refill  . ALBUTEROL SULFATE HFA 108 (90 BASE) MCG/ACT IN AERS   Inhalation   Inhale 2 puffs into the lungs every 6 (six) hours as needed. For shortness of breath         . ALBUTEROL SULFATE (2.5  MG/3ML) 0.083% IN NEBU   Nebulization   Take 2.5 mg by nebulization daily as needed. For shortness of breath         . ALPRAZOLAM 0.5 MG PO TABS   Oral   Take 0.5 mg by mouth 3 (three) times daily as needed. For anxiety and nervous stomach         . VITAMIN C PO   Oral   Take 1 tablet by mouth daily.          Marland Kitchen VITAMIN B-12 PO   Oral   Take 1 tablet by mouth daily.          Marland Kitchen OMEPRAZOLE 20 MG PO CPDR   Oral   Take 20 mg by mouth 2 (two) times daily.         . OXYCODONE-ACETAMINOPHEN 5-500 MG PO CAPS   Oral   Take 1 capsule by mouth every 4 (four) hours as needed. For back pain         . HYDROCODONE-ACETAMINOPHEN 5-325 MG PO TABS   Oral   Take 1 tablet by mouth every 6 (six) hours as needed for pain.   20 tablet   0     BP 124/65  Pulse 75  Temp 97.6 F (36.4 C) (Oral)  Resp 18  Ht 5\' 11"  (1.803 m)  Wt 198 lb (89.812 kg)  BMI 27.62 kg/m2  SpO2 98%  Physical Exam  Nursing note and vitals reviewed. Constitutional: [name] oriented to person, place, and time. He appears well-developed and well-nourished.  HENT:  Head: Normocephalic and atraumatic.  Eyes: EOM are normal.  Neck: Normal range of motion.  Cardiovascular: Normal rate, regular rhythm, normal heart sounds and intact distal pulses.   Pulmonary/Chest: Effort normal and breath sounds normal. No respiratory distress.  Abdominal: Soft. He exhibits no distension. There is no tenderness.  Musculoskeletal: He exhibits tenderness.       Back:       Pain in Hudson of R foot with passive dorsiflexion of toes c/w plantar fasciitis.  Neurological: [name] alert and oriented to person, place, and time.  Skin: Skin is warm and dry.  Psychiatric: He has a normal mood and affect. Judgment normal.    ED Course  Procedures (including critical care time)  Labs Reviewed - No data to display No results found.   1. Chronic low back pain   2. Right sided sciatica   3. Plantar fasciitis of right foot        MDM  Crutches, ice rx-prednisone, 60 mg, 6 rx-hydrocodone, 20 F/u with dr. Carollee Herter, PA 07/13/12 1423

## 2012-07-14 ENCOUNTER — Other Ambulatory Visit: Payer: Self-pay | Admitting: Orthopedic Surgery

## 2012-07-14 NOTE — ED Provider Notes (Signed)
Medical screening examination/treatment/procedure(s) were performed by non-physician practitioner and as supervising physician I was immediately available for consultation/collaboration.   Charles B. Sheldon, MD 07/14/12 0851 

## 2012-11-03 ENCOUNTER — Emergency Department (HOSPITAL_COMMUNITY)
Admission: EM | Admit: 2012-11-03 | Discharge: 2012-11-03 | Disposition: A | Discharge status: Home / Self Care | Payer: Medicare Other | Attending: Emergency Medicine | Admitting: Emergency Medicine

## 2012-11-03 ENCOUNTER — Encounter (HOSPITAL_COMMUNITY): Payer: Self-pay | Admitting: *Deleted

## 2012-11-03 DIAGNOSIS — M79609 Pain in unspecified limb: Secondary | ICD-10-CM | POA: Insufficient documentation

## 2012-11-03 DIAGNOSIS — F17200 Nicotine dependence, unspecified, uncomplicated: Secondary | ICD-10-CM | POA: Insufficient documentation

## 2012-11-03 DIAGNOSIS — Z79899 Other long term (current) drug therapy: Secondary | ICD-10-CM | POA: Insufficient documentation

## 2012-11-03 DIAGNOSIS — G8929 Other chronic pain: Secondary | ICD-10-CM | POA: Insufficient documentation

## 2012-11-03 DIAGNOSIS — Z8739 Personal history of other diseases of the musculoskeletal system and connective tissue: Secondary | ICD-10-CM | POA: Insufficient documentation

## 2012-11-03 DIAGNOSIS — M79672 Pain in left foot: Secondary | ICD-10-CM

## 2012-11-03 DIAGNOSIS — Z8719 Personal history of other diseases of the digestive system: Secondary | ICD-10-CM | POA: Insufficient documentation

## 2012-11-03 DIAGNOSIS — Z8711 Personal history of peptic ulcer disease: Secondary | ICD-10-CM | POA: Insufficient documentation

## 2012-11-03 MED ORDER — HYDROCODONE-ACETAMINOPHEN 5-325 MG PO TABS: 1.0000 | ORAL_TABLET | ORAL | Status: DC | PRN

## 2012-11-03 MED ORDER — OXYCODONE-ACETAMINOPHEN 5-325 MG PO TABS
1.0000 | ORAL_TABLET | Freq: Once | ORAL | Status: AC
  Administered 2012-11-03: 1 via ORAL
  Filled 2012-11-03: qty 1

## 2012-11-03 NOTE — ED Notes (Signed)
Pt with pain with any touch to them

## 2012-11-03 NOTE — ED Notes (Signed)
Pt with pain to bilateral feet with redness and has had skin peel off over the last week, pain continues-burning and pins and needle pain

## 2012-11-03 NOTE — ED Provider Notes (Signed)
History     CSN: [id number]  Arrival date & time 11/03/12  1046   First MD Initiated Contact with Patient 11/03/12 1121      Chief Complaint  Patient presents with  . Foot Pain    (Consider location/radiation/quality/duration/timing/severity/associated sxs/prior treatment) HPI Vincent Hudson is a 57 y.o. male who presents to the ED with fee problems. He states that last week he started to take his socks off and the skin came with them. Since then the feet have been red, swollen and very tender.   Past Medical History  Diagnosis Date  . History of stomach ulcers   . Arthritis   . Chronic back pain   . PUD (peptic ulcer disease)     Past Surgical History  Procedure Laterality Date  . Cholecystectomy    . Abdominal surgery      "for ulcers"  . Leg surgery      History reviewed. No pertinent family history.  History  Substance Use Topics  . Smoking status: Current Every Day Smoker -- 0.50 packs/day    Types: Cigarettes  . Smokeless tobacco: Not on file  . Alcohol Use: Yes     Comment: one beer everyday      Review of Systems  Constitutional: Negative for fever and chills.  HENT: Negative for neck pain.   Respiratory: Negative for shortness of breath.   Cardiovascular: Negative for chest pain.  Musculoskeletal:       Pain and redness of souls of feet.  Skin: Negative for rash and wound.  Neurological: Negative for headaches.  Psychiatric/Behavioral: The patient is not nervous/anxious.     Allergies  Aspirin and Nsaids  Home Medications   Current Outpatient Rx  Name  Route  Sig  Dispense  Refill  . albuterol (PROVENTIL HFA;VENTOLIN HFA) 108 (90 BASE) MCG/ACT inhaler   Inhalation   Inhale 2 puffs into the lungs every 6 (six) hours as needed. For shortness of breath         . ALPRAZolam (XANAX) 0.5 MG tablet   Oral   Take 0.5 mg by mouth 3 (three) times daily as needed. For anxiety and nervous stomach         . Ascorbic Acid (VITAMIN C PO)    Oral   Take 1 tablet by mouth daily.          . Cyanocobalamin (VITAMIN B-12 PO)   Oral   Take 1 tablet by mouth daily.          Marland Kitchen omeprazole (PRILOSEC) 20 MG capsule   Oral   Take 20 mg by mouth 2 (two) times daily.         Marland Kitchen oxyCODONE-acetaminophen (TYLOX) 5-500 MG per capsule   Oral   Take 1 capsule by mouth every 4 (four) hours as needed. For back pain           BP 115/80  Pulse 59  Temp(Src) 98.1 F (36.7 C) (Oral)  Resp 20  Ht 5\' 11"  (1.803 m)  Wt 200 lb (90.719 kg)  BMI 27.91 kg/m2  SpO2 99%  Physical Exam  Nursing note and vitals reviewed. Constitutional: [name] oriented to person, place, and time. He appears well-developed and well-nourished.  HENT:  Head: Normocephalic and atraumatic.  Eyes: EOM are normal.  Neck: Neck supple.  Cardiovascular: Normal rate.   Pulmonary/Chest: Effort normal.  Musculoskeletal:  Redness noted plantar aspect of both feet. Tender on exam. Pedal pulses present and adequate circulation. Good touch sensation.  Neurological: [name] alert and oriented to person, place, and time. No cranial nerve deficit.  Skin: Skin is warm and dry.  Psychiatric: He has a normal mood and affect. His behavior is normal. Judgment and thought content normal.   Assessment: Bilateral foot pain and redness  Plan :Procedures 1:30 pm awaiting Dr. Deretha Emory to examine the patient.  MDM  2:45 pm Dr. Deretha Emory in to examine the patient. Will refer to Dr. Charlsie Merles. Pain management, open shoes to allow air to the feet.  I have reviewed this patient's vital signs, nurses notes and discussed clinical findings and need for follow up with the patient. Patient voices understanding.   Medication List    TAKE these medications       HYDROcodone-acetaminophen 5-325 MG per tablet  Commonly known as:  NORCO/VICODIN  Take 1 tablet by mouth every 4 (four) hours as needed.      ASK your doctor about these medications       albuterol 108 (90 BASE) MCG/ACT  inhaler  Commonly known as:  PROVENTIL HFA;VENTOLIN HFA  Inhale 2 puffs into the lungs every 6 (six) hours as needed. For shortness of breath     ALPRAZolam 0.5 MG tablet  Commonly known as:  XANAX  Take 0.5 mg by mouth 3 (three) times daily as needed. For anxiety and nervous stomach     omeprazole 20 MG capsule  Commonly known as:  PRILOSEC  Take 20 mg by mouth 2 (two) times daily.     TYLOX 5-500 MG per capsule  Generic drug:  oxyCODONE-acetaminophen  Take 1 capsule by mouth every 4 (four) hours as needed. For back pain     VITAMIN B-12 PO  Take 1 tablet by mouth daily.     VITAMIN C PO  Take 1 tablet by mouth daily.               [facility] Orlene Och, [place] 11/03/12 [phone]

## 2012-11-03 NOTE — ED Provider Notes (Signed)
Medical screening examination/treatment/procedure(s) were conducted as a shared visit with non-physician practitioner(s) and myself.  I personally evaluated the patient during the encounter  Patient seen by me. Bilateral of soles of the feet are red and tender no open blisters or sores. Patient has had the skin peel also sweats profusely when in his shoes tennis shoes with socks [place] etiology is not clear patient has been treating himself with the athlete's foot spray it has not gotten worse since he tennis don't think it's an allergic reaction to that. In suspicious this could be a fungal infection. The other thing is it almost looks a little bit like trench foot. Recommend wearing sandals or open shoes no socks and followup with podiatry. Pain medication as needed.  Shelda Jakes, MD 11/03/12 [phone]

## 2013-04-01 ENCOUNTER — Emergency Department (HOSPITAL_COMMUNITY)
Admission: EM | Admit: 2013-04-01 | Discharge: 2013-04-01 | Disposition: A | Discharge status: Home / Self Care | Payer: Medicare Other | Attending: Emergency Medicine | Admitting: Emergency Medicine

## 2013-04-01 ENCOUNTER — Encounter (HOSPITAL_COMMUNITY): Payer: Self-pay | Admitting: *Deleted

## 2013-04-01 ENCOUNTER — Emergency Department (HOSPITAL_COMMUNITY): Payer: Medicare Other

## 2013-04-01 DIAGNOSIS — W208XXA Other cause of strike by thrown, projected or falling object, initial encounter: Secondary | ICD-10-CM | POA: Insufficient documentation

## 2013-04-01 DIAGNOSIS — S62639B Displaced fracture of distal phalanx of unspecified finger, initial encounter for open fracture: Secondary | ICD-10-CM | POA: Insufficient documentation

## 2013-04-01 DIAGNOSIS — Y9389 Activity, other specified: Secondary | ICD-10-CM | POA: Insufficient documentation

## 2013-04-01 DIAGNOSIS — Z8711 Personal history of peptic ulcer disease: Secondary | ICD-10-CM | POA: Insufficient documentation

## 2013-04-01 DIAGNOSIS — Z79899 Other long term (current) drug therapy: Secondary | ICD-10-CM | POA: Insufficient documentation

## 2013-04-01 DIAGNOSIS — G8929 Other chronic pain: Secondary | ICD-10-CM | POA: Insufficient documentation

## 2013-04-01 DIAGNOSIS — S61219A Laceration without foreign body of unspecified finger without damage to nail, initial encounter: Secondary | ICD-10-CM

## 2013-04-01 DIAGNOSIS — Z23 Encounter for immunization: Secondary | ICD-10-CM | POA: Insufficient documentation

## 2013-04-01 DIAGNOSIS — F17200 Nicotine dependence, unspecified, uncomplicated: Secondary | ICD-10-CM | POA: Insufficient documentation

## 2013-04-01 DIAGNOSIS — Y929 Unspecified place or not applicable: Secondary | ICD-10-CM | POA: Insufficient documentation

## 2013-04-01 DIAGNOSIS — Z8739 Personal history of other diseases of the musculoskeletal system and connective tissue: Secondary | ICD-10-CM | POA: Insufficient documentation

## 2013-04-01 DIAGNOSIS — S62609B Fracture of unspecified phalanx of unspecified finger, initial encounter for open fracture: Secondary | ICD-10-CM

## 2013-04-01 MED ORDER — OXYCODONE-ACETAMINOPHEN 5-325 MG PO TABS
1.0000 | ORAL_TABLET | Freq: Once | ORAL | Status: AC
  Administered 2013-04-01: 1 via ORAL
  Filled 2013-04-01: qty 1

## 2013-04-01 MED ORDER — SULFAMETHOXAZOLE-TRIMETHOPRIM 800-160 MG PO TABS: 1.0000 | ORAL_TABLET | Freq: Two times a day (BID) | ORAL | Status: DC

## 2013-04-01 MED ORDER — SULFAMETHOXAZOLE-TMP DS 800-160 MG PO TABS
1.0000 | ORAL_TABLET | Freq: Once | ORAL | Status: AC
  Administered 2013-04-01: 1 via ORAL
  Filled 2013-04-01: qty 1

## 2013-04-01 MED ORDER — TETANUS-DIPHTH-ACELL PERTUSSIS 5-2.5-18.5 LF-MCG/0.5 IM SUSP
0.5000 mL | Freq: Once | INTRAMUSCULAR | Status: AC
  Administered 2013-04-01: 0.5 mL via INTRAMUSCULAR
  Filled 2013-04-01: qty 0.5

## 2013-04-01 MED ORDER — OXYCODONE-ACETAMINOPHEN 5-325 MG PO TABS: 1.0000 | ORAL_TABLET | ORAL | Status: DC | PRN

## 2013-04-01 NOTE — ED Notes (Signed)
Cutting large tree last night when tree fell over onto L 5th digit. Cleaned and wrapped last night, bleeding subsided.  Upon going to work this AM, site began bleeding again. Laceration from base of nailbed extending laterally at tip of finger.  Significant swelling and bruising..  Old blood present.

## 2013-04-01 NOTE — ED Provider Notes (Signed)
Medical screening examination/treatment/procedure(s) were performed by non-physician practitioner and as supervising physician I was immediately available for consultation/collaboration.   Akshith Moncus W Francisca Harbuck, MD 04/01/13 1357 

## 2013-04-01 NOTE — ED Notes (Signed)
Xeroform dressing applied with telfa, finger splint, buddy taped to left ring finger and wrapped with kling.

## 2013-04-01 NOTE — ED Provider Notes (Signed)
CSN: [id number]     Arrival date & time 04/01/13  4098 History   First MD Initiated Contact with Patient 04/01/13 0912     Chief Complaint  Patient presents with  . Finger Injury   (Consider location/radiation/quality/duration/timing/severity/associated sxs/prior Treatment) HPI Comments: Vincent Hudson is a 57 y.o. male who presents to the Emergency Department complaining of crush injury to the distal left fifth finger that occurred greater than 12 hours ago.  C/o pain, laceration and continued bleeding around the base of the fingernail.  He denies wrist pain, unknown last Td  Patient is a 57 y.o. male presenting with hand pain. The history is provided by the patient.  Hand Pain This is a new problem. The current episode started yesterday (> 12 hrs ago). The problem occurs constantly. The problem has been unchanged. Associated symptoms include arthralgias and joint swelling. Pertinent negatives include no chills, fever, nausea, numbness, vomiting or weakness. Associated symptoms comments: 'tingling" of the distal finger. Exacerbated by: movement and palpation. Treatments tried: bandaged to the finger last evening. The treatment provided no relief.    Past Medical History  Diagnosis Date  . History of stomach ulcers   . Arthritis   . Chronic back pain   . PUD (peptic ulcer disease)    Past Surgical History  Procedure Laterality Date  . Cholecystectomy    . Abdominal surgery      "for ulcers"  . Leg surgery     History reviewed. No pertinent family history. History  Substance Use Topics  . Smoking status: Current Every Day Smoker -- 0.50 packs/day    Types: Cigarettes  . Smokeless tobacco: Not on file  . Alcohol Use: Yes     Comment: one beer everyday    Review of Systems  Constitutional: Negative for fever and chills.  Gastrointestinal: Negative for nausea and vomiting.  Genitourinary: Negative for dysuria and difficulty urinating.  Musculoskeletal: Positive for joint  swelling and arthralgias.  Skin: Positive for wound. Negative for color change.  Neurological: Negative for weakness and numbness.  All other systems reviewed and are negative.    Allergies  Aspirin and Nsaids  Home Medications   Current Outpatient Rx  Name  Route  Sig  Dispense  Refill  . albuterol (PROVENTIL HFA;VENTOLIN HFA) 108 (90 BASE) MCG/ACT inhaler   Inhalation   Inhale 2 puffs into the lungs every 6 (six) hours as needed. For shortness of breath         . ALPRAZolam (XANAX) 0.5 MG tablet   Oral   Take 0.5 mg by mouth 3 (three) times daily as needed. For anxiety and nervous stomach         . Ascorbic Acid (VITAMIN C PO)   Oral   Take 1 tablet by mouth daily.          . Cyanocobalamin (VITAMIN B-12 PO)   Oral   Take 1 tablet by mouth daily.          Marland Kitchen omeprazole (PRILOSEC) 20 MG capsule   Oral   Take 20 mg by mouth 2 (two) times daily.         Marland Kitchen oxyCODONE-acetaminophen (TYLOX) 5-500 MG per capsule   Oral   Take 1 capsule by mouth every 4 (four) hours as needed. For back pain          BP 126/80  Pulse 127  Temp(Src) 98 F (36.7 C) (Oral)  Resp 15  Ht 5\' 11"  (1.803 m)  Wt 195 lb (  88.451 kg)  BMI 27.21 kg/m2  SpO2 95% Physical Exam  Nursing note and vitals reviewed. Constitutional: [name] oriented to person, place, and time. He appears well-developed and well-nourished.  Patient appears anxious  HENT:  Head: Normocephalic and atraumatic.  Cardiovascular: Normal rate, regular rhythm, normal heart sounds and intact distal pulses.   No murmur heard. Pulmonary/Chest: Effort normal and breath sounds normal. No respiratory distress.  Musculoskeletal: He exhibits edema and tenderness.       Left hand: He exhibits tenderness, laceration and swelling. He exhibits normal range of motion, normal two-point discrimination and normal capillary refill. Normal sensation noted. Normal strength noted.       Hands: Laceration to the base of the left fifth  fingernail.  Bleeding controlled.  Mild STS.  No bony deformity, pt has full ROM of the finger with pain upon flexion at the DIP.  No subungual hematoma.  Neurological: [name] alert and oriented to person, place, and time. He exhibits normal muscle tone. Coordination normal.  Skin: Skin is warm. Laceration noted.  See MS exam    ED Course  Procedures (including critical care time) Labs Review Labs Reviewed - No data to display Imaging Review Dg Finger Little Left  04/01/2013   CLINICAL DATA:  Injury  EXAM: LEFT LITTLE FINGER 2+V  COMPARISON:  None.  FINDINGS: Fracture of the distal 5th phalanx with mild displacement. Fracture is proximal to the tuft. Mild degenerative change in the DIP joint.  IMPRESSION: Mildly displaced fracture distal 5th phalanx.   Electronically Signed   By: Marlan Palau M.D.   On: 04/01/2013 09:34    MDM   Laceration of the distal left fifth finger.  Bleeding controlled.  No subungual hematoma.  Laceration > 12 hrs ago.  Will irrigate wound, update Td, begin antibiotics and splint the finger.    Wound was irrigated with saline, xeroform dressing and finger splinted.  Pain improved after po percocet.    1015  Consulted Dr. Hilda Lias will see pt in his office on Monday morning    [name], PA-C 04/01/13 1022

## 2014-02-01 ENCOUNTER — Encounter (HOSPITAL_COMMUNITY): Payer: Self-pay | Admitting: Emergency Medicine

## 2014-02-01 ENCOUNTER — Observation Stay (HOSPITAL_COMMUNITY)
Admission: EM | Admit: 2014-02-01 | Discharge: 2014-02-01 | Discharge status: Against Medical Advice | Payer: Medicare Other | Attending: Internal Medicine | Admitting: Internal Medicine

## 2014-02-01 ENCOUNTER — Emergency Department (HOSPITAL_COMMUNITY): Payer: Medicare Other

## 2014-02-01 DIAGNOSIS — R0602 Shortness of breath: Secondary | ICD-10-CM | POA: Diagnosis not present

## 2014-02-01 DIAGNOSIS — G8929 Other chronic pain: Secondary | ICD-10-CM | POA: Insufficient documentation

## 2014-02-01 DIAGNOSIS — R42 Dizziness and giddiness: Secondary | ICD-10-CM | POA: Diagnosis not present

## 2014-02-01 DIAGNOSIS — H539 Unspecified visual disturbance: Secondary | ICD-10-CM | POA: Diagnosis not present

## 2014-02-01 DIAGNOSIS — Z792 Long term (current) use of antibiotics: Secondary | ICD-10-CM | POA: Diagnosis not present

## 2014-02-01 DIAGNOSIS — K259 Gastric ulcer, unspecified as acute or chronic, without hemorrhage or perforation: Secondary | ICD-10-CM | POA: Diagnosis not present

## 2014-02-01 DIAGNOSIS — M129 Arthropathy, unspecified: Secondary | ICD-10-CM | POA: Diagnosis not present

## 2014-02-01 DIAGNOSIS — R079 Chest pain, unspecified: Secondary | ICD-10-CM | POA: Diagnosis present

## 2014-02-01 DIAGNOSIS — F17200 Nicotine dependence, unspecified, uncomplicated: Secondary | ICD-10-CM | POA: Insufficient documentation

## 2014-02-01 LAB — COMPREHENSIVE METABOLIC PANEL
ALT: 32 U/L (ref 0–53)
ANION GAP: 11 (ref 5–15)
AST: 27 U/L (ref 0–37)
Albumin: 3.9 g/dL (ref 3.5–5.2)
Alkaline Phosphatase: 92 U/L (ref 39–117)
BUN: 14 mg/dL (ref 6–23)
CALCIUM: 9.5 mg/dL (ref 8.4–10.5)
CO2: 27 mEq/L (ref 19–32)
Chloride: 101 mEq/L (ref 96–112)
Creatinine, Ser: 0.95 mg/dL (ref 0.50–1.35)
GFR calc Af Amer: >90 mL/min (ref >90)
GFR calc non Af Amer: 89 mL/min — ABNORMAL LOW (ref >90)
Glucose, Bld: 112 mg/dL — ABNORMAL HIGH (ref 70–99)
Potassium: 4 mEq/L (ref 3.7–5.3)
Sodium: 139 mEq/L (ref 137–147)
TOTAL PROTEIN: 7.7 g/dL (ref 6.0–8.3)
Total Bilirubin: 0.2 mg/dL — ABNORMAL LOW (ref 0.3–1.2)

## 2014-02-01 LAB — CBC
HEMATOCRIT: 42.8 % (ref 39.0–52.0)
Hemoglobin: 14.9 g/dL (ref 13.0–17.0)
MCH: 33.2 pg (ref 26.0–34.0)
MCHC: 34.8 g/dL (ref 30.0–36.0)
MCV: 95.3 fL (ref 78.0–100.0)
Platelets: 270 10*3/uL (ref 150–400)
RBC: 4.49 MIL/uL (ref 4.22–5.81)
RDW: 12.8 % (ref 11.5–15.5)
WBC: 8.9 10*3/uL (ref 4.0–10.5)

## 2014-02-01 LAB — URINALYSIS, ROUTINE W REFLEX MICROSCOPIC
Bilirubin Urine: NEGATIVE
Glucose, UA: NEGATIVE mg/dL
Hgb urine dipstick: NEGATIVE
KETONES UR: NEGATIVE mg/dL
LEUKOCYTES UA: NEGATIVE
NITRITE: NEGATIVE
PROTEIN: NEGATIVE mg/dL
Specific Gravity, Urine: <1.005 — ABNORMAL LOW (ref 1.005–1.030)
Urobilinogen, UA: 0.2 mg/dL (ref 0.0–1.0)
pH: 5.5 (ref 5.0–8.0)

## 2014-02-01 LAB — APTT: aPTT: 31 seconds (ref 24–37)

## 2014-02-01 LAB — PROTIME-INR
INR: 0.95 (ref 0.00–1.49)
Prothrombin Time: 12.7 seconds (ref 11.6–15.2)

## 2014-02-01 LAB — I-STAT TROPONIN, ED: Troponin i, poc: 0 ng/mL (ref 0.00–0.08)

## 2014-02-01 MED ORDER — ASPIRIN 81 MG PO CHEW
324.0000 mg | CHEWABLE_TABLET | Freq: Once | ORAL | Status: AC
  Administered 2014-02-01: 324 mg via ORAL
  Filled 2014-02-01: qty 4

## 2014-02-01 MED ORDER — NITROGLYCERIN 0.4 MG SL SUBL
0.4000 mg | SUBLINGUAL_TABLET | SUBLINGUAL | Status: DC | PRN
  Administered 2014-02-01 (×2): 0.4 mg via SUBLINGUAL
  Filled 2014-02-01: qty 1

## 2014-02-01 MED ORDER — SODIUM CHLORIDE 0.9 % IV SOLN
20.0000 mL | INTRAVENOUS | Status: DC
  Administered 2014-02-01: 20 mL via INTRAVENOUS

## 2014-02-01 NOTE — ED Notes (Signed)
Pt states he is going home. Explained to him that the physician has recommended he stay patient states he "has to go while he has a ride available". Patient signed out AMA, ED physician aware.

## 2014-02-01 NOTE — ED Notes (Signed)
chest pain 5/10, first nitro given. HR 57 BP 133/80

## 2014-02-01 NOTE — ED Provider Notes (Signed)
CSN: [id number]     Arrival date & time 02/01/14  1410 History  This chart was scribed for Shaune Pollack, MD by Irene Pap, ED Scribe. This patient was seen in room APA09/APA09 and patient care was started at 2:15 PM.     Chief Complaint  Patient presents with  . Chest Pain   Patient is a 58 y.o. male presenting with chest pain. The history is provided by the patient. No language interpreter was used.  Chest Pain Pain location:  L chest Pain quality: sharp   Pain radiates to:  L arm Pain severity:  Moderate Onset quality:  Sudden Duration:  45 minutes Timing:  Constant Progression:  Waxing and waning Chronicity:  New Context: at rest   Ineffective treatments:  None tried Associated symptoms: dizziness and shortness of breath   Associated symptoms: no fever    HPI Comments: Vincent Hudson is a 58 y.o. male with a history of bleeding ulcers who presents to the Emergency Department complaining of waxing and waning sharp chest pain onset 45 minutes ago. He states that the pain radiates to his right arm, rated it 8/10 at its worse, and that the pain is currently 5/10. He states that he was sitting on the couch watching tv when the pain started. He states that he drank one beer today and has had all normal activity. He states that he did not take anything for the pain. He reports associated visual disturbances, such as seeing black spots, and dizziness. He denies fever, nausea, vomiting, cough, or abdominal pain. He states that he has had pain like this 10 years ago and was seen at [facility] where he states he had a MI. He denies history of DM. He reports that he is on disability for his stomach and his back. He states that [facility] is his PCP. He states that he smokes and occasionally drinks. He reports that he is single and lives with a friend. He was brought in by the friend's daughter.   Past Medical History  Diagnosis Date  . History of stomach ulcers   . Arthritis   . Chronic  back pain   . PUD (peptic ulcer disease)    Past Surgical History  Procedure Laterality Date  . Cholecystectomy    . Abdominal surgery      "for ulcers"  . Leg surgery     No family history on file. History  Substance Use Topics  . Smoking status: Current Every Day Smoker -- 0.50 packs/day    Types: Cigarettes  . Smokeless tobacco: Not on file  . Alcohol Use: Yes     Comment: one beer everyday    Review of Systems  Constitutional: Negative for fever.  Eyes: Positive for visual disturbance.  Respiratory: Positive for shortness of breath.   Cardiovascular: Positive for chest pain.  Musculoskeletal: Negative for arthralgias.  Neurological: Positive for dizziness.  All other systems reviewed and are negative.  Allergies  Penicillins; Aspirin; and Nsaids  Home Medications   Prior to Admission medications   Medication Sig Start Date End Date Taking? Authorizing Provider  albuterol (PROVENTIL HFA;VENTOLIN HFA) 108 (90 BASE) MCG/ACT inhaler Inhale 2 puffs into the lungs every 6 (six) hours as needed. For shortness of breath    Historical Provider, MD  ALPRAZolam Duanne Moron) 0.5 MG tablet Take 0.5 mg by mouth 3 (three) times daily as needed. For anxiety and nervous stomach    Historical Provider, MD  omeprazole (PRILOSEC) 20 MG capsule  Take 20 mg by mouth 2 (two) times daily.    Historical Provider, MD  oxyCODONE-acetaminophen (PERCOCET/ROXICET) 5-325 MG per tablet Take 1 tablet by mouth every 4 (four) hours as needed for pain.    Historical Provider, MD  oxyCODONE-acetaminophen (PERCOCET/ROXICET) 5-325 MG per tablet Take 1 tablet by mouth every 4 (four) hours as needed for pain. 04/01/13   Tammy L. Triplett, PA-C  sulfamethoxazole-trimethoprim (SEPTRA DS) 800-160 MG per tablet Take 1 tablet by mouth 2 (two) times daily. 04/01/13   Tammy L. Triplett, PA-C   BP 148/90  Pulse 66  Temp(Src) 98.7 F (37.1 C) (Oral)  Resp 16  SpO2 97%  Physical Exam  Nursing note and vitals  reviewed. Constitutional: [name] oriented to person, place, and time. He appears well-developed and well-nourished.  HENT:  Head: Normocephalic and atraumatic.  Right Ear: External ear normal.  Left Ear: External ear normal.  Nose: Nose normal.  Mouth/Throat: Oropharynx is clear and moist.  Eyes: Conjunctivae and EOM are normal. Pupils are equal, round, and reactive to light.  Neck: Normal range of motion. Neck supple.  Cardiovascular: Normal rate, regular rhythm, normal heart sounds and intact distal pulses.   Pulmonary/Chest: Effort normal and breath sounds normal.  Abdominal: Soft. Bowel sounds are normal.  Musculoskeletal: Normal range of motion.  Neurological: [name] alert and oriented to person, place, and time. He has normal reflexes.  Skin: Skin is warm and dry.  Psychiatric: He has a normal mood and affect. His behavior is normal. Thought content normal.    ED Course  Procedures (including critical care time) DIAGNOSTIC STUDIES: Oxygen Saturation is 97% on room air, normal by my interpretation.    COORDINATION OF CARE: 2:25 PM-Discussed treatment plan which includes  (CXR, CBC panel, CMP, UA) with pt at bedside and pt agreed to plan.   Labs Review Labs Reviewed  COMPREHENSIVE METABOLIC PANEL - Abnormal; Notable for the following:    Glucose, Bld 112 (*)    Total Bilirubin 0.2 (*)    GFR calc non Af Amer 89 (*)    All other components within normal limits  URINALYSIS, ROUTINE W REFLEX MICROSCOPIC - Abnormal; Notable for the following:    Specific Gravity, Urine <1.005 (*)    All other components within normal limits  APTT  CBC  PROTIME-INR  I-STAT TROPOININ, ED   Imaging Review Dg Chest Portable 1 View  02/01/2014   CLINICAL DATA:  Chest pain  EXAM: PORTABLE CHEST - 1 VIEW  COMPARISON:  02/13/2012  FINDINGS: Cardiomediastinal silhouette is stable. There is elevation of the left hemidiaphragm with left basilar trace atelectasis or infiltrate. No pulmonary edema. Old  left rib fractures.  IMPRESSION: Elevation of the left hemidiaphragm. Trace left basilar atelectasis on infiltrate. No pulmonary edema. Old left rib fractures.   Electronically Signed   By: Lahoma Crocker M.D.   On: 02/01/2014 14:56    EKG Interpretation None      MDM   Final diagnoses:  Chest pain, unspecified chest pain type   3:30 PM Patient states pain free now.  Patient initially agreed to stay but then left ama and I was unable to discuss risks with him. I personally performed the services described in this documentation, which was scribed in my presence. The recorded information has been reviewed and considered.   Shaune Pollack, MD 02/01/14 [phone]

## 2014-02-01 NOTE — ED Notes (Signed)
Pt c/o of sharp chest pain which started around 1330, while sitting on couch watching tv. Last coupled of minutes then subside around 5 minutes later. Also c/o of dizziness and SOB with both episodes. Pt in bed, hooked to bedside monitor, states pain is now 4-5 on left chest with radiation to left arm, mild SOB. MD at bedside.

## 2014-02-01 NOTE — ED Notes (Signed)
Pt denies chest pain at this time.

## 2014-02-01 NOTE — ED Notes (Signed)
2nd nitro given at this time. pt reports pain 2/10. HR 67 BP 120/77. Pts oxygen stat decreased to 88-90 at rest/sleeping. 2L [place] applied

## 2014-09-13 ENCOUNTER — Encounter (INDEPENDENT_AMBULATORY_CARE_PROVIDER_SITE_OTHER): Payer: Self-pay | Admitting: *Deleted

## 2014-10-31 ENCOUNTER — Other Ambulatory Visit: Payer: Self-pay

## 2014-10-31 ENCOUNTER — Ambulatory Visit (INDEPENDENT_AMBULATORY_CARE_PROVIDER_SITE_OTHER): Payer: Medicare Other | Admitting: Nurse Practitioner

## 2014-10-31 ENCOUNTER — Encounter: Payer: Self-pay | Admitting: Nurse Practitioner

## 2014-10-31 VITALS — BP 140/80 | HR 61 | Temp 98.1°F | Ht 71.0 in | Wt 197.6 lb

## 2014-10-31 DIAGNOSIS — R194 Change in bowel habit: Secondary | ICD-10-CM | POA: Diagnosis not present

## 2014-10-31 DIAGNOSIS — R1030 Lower abdominal pain, unspecified: Secondary | ICD-10-CM | POA: Diagnosis not present

## 2014-10-31 DIAGNOSIS — R109 Unspecified abdominal pain: Secondary | ICD-10-CM | POA: Insufficient documentation

## 2014-10-31 DIAGNOSIS — K279 Peptic ulcer, site unspecified, unspecified as acute or chronic, without hemorrhage or perforation: Secondary | ICD-10-CM

## 2014-10-31 MED ORDER — PEG 3350-KCL-NA BICARB-NACL 420 G PO SOLR: 4000.0000 mL | Freq: Once | ORAL | Status: DC

## 2014-10-31 NOTE — Assessment & Plan Note (Signed)
Patient admits a change in his bowel habits within the past year. Over that time frame, in addition to increasing weakness and fatigue, has had lower abdominal crampy pain as well as 1-2 incidents an month of fecal urgency, diarrhea which is described as "Green and mucousy." This is different for him. Denies unintentional weight loss, fever, chills, any other red flag/warning signs or symptoms. Denies hematochezia. Questionable melena which may be related to his long-standing history of peptic ulcer disease and bleeding ulcers. Given his symptoms and the fact that he has not had a colonoscopy since 1998 at the most recent, we will proceed with a diagnostic colonoscopy in addition to his endoscopy.  Proceed withTCS with Dr. Oneida Alar in the OR with propofol in near future: the risks, benefits, and alternatives have been discussed with the patient in detail. The patient states understanding and desires to proceed.  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 Also on chronic pain medication and anxiety medications. No diabetes medications or anticoagulants. For the reasons above we will proceed with the procedure in the OR with propofol

## 2014-10-31 NOTE — Progress Notes (Signed)
Primary Care Physician:  Maricela Curet, MD Primary Gastroenterologist:  Dr. Oneida Alar  Chief Complaint  Patient presents with  . Colonoscopy    HPI:   59 year old male presents for evaluation for colonoscopy. PCP labs reviewed in entirety, patient is not anemic. Labs essentially looked good. Referred for an office evaluation versus from triage due to chronic alcohol use. Today he states his last colonoscopy (he thinks) was in 1998 while admitted for GI hemorrhage by Dr. Laural Golden. Results not available in the system. Admits abdominal pain about once a month which is generalized, feels sharp and "like it's pushing straight through me." Has a history of PUD and is taking Prilosec bid. Has acid reflux symptoms about twice a month which he'll take TUMS for. Has had PUD several times from [place] to 1998 with bleeding. States he has black, soft stools occasionally, about once a month. Also has been having occasional bouts of fecal urgency with subsequent green, mucousy, diarrhea for the past year. Denies fever, chills, unintentional weight loss. Admits increasing fatigue and weakness for the past year. Denies any other upper or lower GI symptoms. States his "ulcer surgery" was when they took out part of his intestines as well. No record of this could be found in the system.  Past Medical History  Diagnosis Date  . History of stomach ulcers   . Arthritis   . Chronic back pain   . PUD (peptic ulcer disease)     Past Surgical History  Procedure Laterality Date  . Cholecystectomy    . Abdominal surgery      "for ulcers"  . Leg surgery      Current Outpatient Prescriptions  Medication Sig Dispense Refill  . albuterol (PROVENTIL HFA;VENTOLIN HFA) 108 (90 BASE) MCG/ACT inhaler Inhale 2 puffs into the lungs every 6 (six) hours as needed. For shortness of breath    . ALPRAZolam (XANAX) 0.5 MG tablet Take 0.5 mg by mouth 3 (three) times daily as needed. For anxiety and nervous stomach    .  omeprazole (PRILOSEC) 20 MG capsule Take 20 mg by mouth 2 (two) times daily.    Marland Kitchen oxyCODONE-acetaminophen (PERCOCET/ROXICET) 5-325 MG per tablet Take 1 tablet by mouth every 4 (four) hours as needed for pain.     No current facility-administered medications for this visit.    Allergies as of 10/31/2014 - Review Complete 02/01/2014  Allergen Reaction Noted  . Penicillins Anaphylaxis and Rash 04/01/2013  . Aspirin Other (See Comments)   . Nsaids Other (See Comments) 02/13/2012    Family History  Problem Relation Age of Onset  . Colon cancer Maternal Uncle   . Colon cancer Paternal Uncle     History   Social History  . Marital Status: Divorced    Spouse Name: N/A  . Number of Children: N/A  . Years of Education: N/A   Occupational History  . Not on file.   Social History Main Topics  . Smoking status: Current Every Day Smoker -- 0.50 packs/day    Types: Cigarettes  . Smokeless tobacco: Not on file  . Alcohol Use: 0.0 oz/week    0 Standard drinks or equivalent per week     Comment: 12 pack a week  . Drug Use: No  . Sexual Activity: Not on file   Other Topics Concern  . Not on file   Social History Narrative    Review of Systems: General: Negative for anorexia, weight loss, fever, chills. Eyes: Negative for vision changes.  ENT:  Negative for hoarseness, difficulty swallowing. CV: Negative for chest pain, angina, palpitations, peripheral edema.  Respiratory: Negative for dyspnea at rest, dyspnea on exertion, cough, sputum, wheezing.  GI: See history of present illness. MS: Negative for joint pain. Admits chronic low back pain.  Derm: Negative for rash or itching.  Neuro: Negative for weakness, seizure, memory loss, confusion.  Psych: Negative for anxiety, depression.  Endo: Negative for unusual weight change.  Heme: Negative for bruising or bleeding. Allergy: Negative for rash or hives.    Physical Exam: BP 140/80 mmHg  Pulse 61  Temp(Src) 98.1 F (36.7 C)  (Oral)  Ht 5\' 11"  (1.803 m)  Wt 197 lb 9.6 oz (89.631 kg)  BMI 27.57 kg/m2 General:   Alert and oriented. Pleasant and cooperative. Well-nourished and well-developed.  Head:  Normocephalic and atraumatic. Eyes:  Without icterus, sclera clear and conjunctiva pink.  Ears:  Normal auditory acuity. Mouth:  No deformity or lesions, oral mucosa pink. No OP edema. Neck:  Supple, without mass or thyromegaly. Lungs:  Clear to auscultation bilaterally. No wheezes, rales, or rhonchi. No distress.  Heart:  S1, S2 present without murmurs appreciated.  Abdomen:  +BS, soft, and non-distended. Mild to moderate TTP LLQ. No HSM noted. No guarding or rebound. No masses appreciated.  Rectal:  Deferred  Msk:  Symmetrical without gross deformities. Normal posture. Extremities:  Without clubbing or edema. Neurologic:  Alert and  oriented x4;  grossly normal neurologically. Skin:  Intact without significant lesions or rashes. Psych:  Alert and cooperative. Normal mood and affect.     10/31/2014 9:51 AM

## 2014-10-31 NOTE — Progress Notes (Signed)
cc'ed to pcp °

## 2014-10-31 NOTE — Assessment & Plan Note (Signed)
Patient with generalized abdominal pain. At times pain is epigastric and likely related to possible flare of his peptic ulcer disease and/or gastritis. Also with lower abdominal cramping pain. Per the patient is last colonoscopy was likely in 1998. Is also having change in bowel habits over the past year as well as increasing weakness and fatigue over this time. He would normally generally be due for screening colonoscopy this point however given his symptoms we'll proceed with a diagnostic colonoscopy as well as endoscopy to further evaluate epigastric pain and possibility of active ulcer disease/gastritis.  Proceed with TCS and EGD with Dr. Oneida Alar in the OR with propofol in near future: the risks, benefits, and alternatives have been discussed with the patient in detail. The patient states understanding and desires to proceed.  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 Also on chronic pain medication and anxiety medications. No diabetes medications or anticoagulants. For the reasons above we will proceed with the procedure in the OR with propofol

## 2014-10-31 NOTE — Assessment & Plan Note (Signed)
Patient with a long-standing history of peptic ulcer disease including bleeding ulcers. Per the patient's last major incident was in 1998 for he hemorrhage quite significantly at [facility]. Due to that time frame, records are not available at this time. Patient is currently having epigastric pain about twice a week as well as about once a week with black softer stools. Question possibility of melena. Is on Prilosec 20 mg twice a day. No other red flag/warning signs or symptoms. At this point given his need for colonoscopy I feel it is worthwhile to proceed with an endoscopy at same time as well to evaluate his peptic ulcer disease.  Proceed with EGD with Dr. Oneida Alar in the OR with propofol in near future: the risks, benefits, and alternatives have been discussed with the patient in detail. The patient states understanding and desires to proceed.  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 Also on chronic pain medication and anxiety medications. No diabetes medications or anticoagulants. For the reasons above we will proceed with the procedure in the OR with propofol

## 2014-10-31 NOTE — Patient Instructions (Signed)
1. We will schedule your procedures (colonoscopy and endoscopy) for you. 2. Further recommendations to be based on the results of your procedures

## 2014-11-15 ENCOUNTER — Encounter (HOSPITAL_COMMUNITY)
Admission: RE | Admit: 2014-11-15 | Discharge: 2014-11-15 | Disposition: A | Discharge status: Home / Self Care | Payer: Medicare Other | Source: Ambulatory Visit | Attending: Gastroenterology | Admitting: Gastroenterology

## 2014-11-15 ENCOUNTER — Encounter (HOSPITAL_COMMUNITY): Payer: Self-pay

## 2014-11-15 DIAGNOSIS — Z01812 Encounter for preprocedural laboratory examination: Secondary | ICD-10-CM | POA: Diagnosis present

## 2014-11-15 DIAGNOSIS — R109 Unspecified abdominal pain: Secondary | ICD-10-CM | POA: Insufficient documentation

## 2014-11-15 HISTORY — DX: Acute myocardial infarction, unspecified: I21.9

## 2014-11-15 HISTORY — DX: Other cervical disc degeneration, unspecified cervical region: M50.30

## 2014-11-15 HISTORY — DX: Anxiety disorder, unspecified: F41.9

## 2014-11-15 HISTORY — DX: Gastro-esophageal reflux disease without esophagitis: K21.9

## 2014-11-15 HISTORY — DX: Unspecified asthma, uncomplicated: J45.909

## 2014-11-15 LAB — CBC WITH DIFFERENTIAL/PLATELET
BASOS PCT: 0 % (ref 0–1)
Basophils Absolute: 0 10*3/uL (ref 0.0–0.1)
EOS ABS: 0.3 10*3/uL (ref 0.0–0.7)
Eosinophils Relative: 3 % (ref 0–5)
HCT: 41.8 % (ref 39.0–52.0)
Hemoglobin: 14.1 g/dL (ref 13.0–17.0)
Lymphocytes Relative: 32 % (ref 12–46)
Lymphs Abs: 3 10*3/uL (ref 0.7–4.0)
MCH: 32.9 pg (ref 26.0–34.0)
MCHC: 33.7 g/dL (ref 30.0–36.0)
MCV: 97.7 fL (ref 78.0–100.0)
MONO ABS: 0.9 10*3/uL (ref 0.1–1.0)
Monocytes Relative: 9 % (ref 3–12)
NEUTROS ABS: 5.3 10*3/uL (ref 1.7–7.7)
NEUTROS PCT: 56 % (ref 43–77)
Platelets: 260 10*3/uL (ref 150–400)
RBC: 4.28 MIL/uL (ref 4.22–5.81)
RDW: 12.9 % (ref 11.5–15.5)
WBC: 9.5 10*3/uL (ref 4.0–10.5)

## 2014-11-15 LAB — BASIC METABOLIC PANEL
ANION GAP: 7 (ref 5–15)
BUN: 13 mg/dL (ref 6–20)
CO2: 29 mmol/L (ref 22–32)
Calcium: 9 mg/dL (ref 8.9–10.3)
Chloride: 103 mmol/L (ref 101–111)
Creatinine, Ser: 0.89 mg/dL (ref 0.61–1.24)
GFR calc Af Amer: >60 mL/min (ref >60)
Glucose, Bld: 97 mg/dL (ref 65–99)
Potassium: 4.2 mmol/L (ref 3.5–5.1)
SODIUM: 139 mmol/L (ref 135–145)

## 2014-11-15 NOTE — Pre-Procedure Instructions (Signed)
Patient given information to sign up for my chart at home. 

## 2014-11-15 NOTE — Patient Instructions (Signed)
Vincent Hudson  11/15/2014   Your procedure is scheduled on:   11/21/2014  Report to [facility] at  16  AM.  Call this number if you have problems the morning of surgery: [phone]   Remember:   Do not eat food or drink liquids after midnight.   Take these medicines the morning of surgery with A SIP OF WATER:  Xanax, prilosec, oxycodone. Take your inhaler before you come and bring them with you.   Do not wear jewelry, make-up or nail polish.  Do not wear lotions, powders, or perfumes.   Do not shave 48 hours prior to surgery. Men may shave face and neck.  Do not bring valuables to the hospital.  [facility] is not responsible for any belongings or valuables.               Contacts, dentures or bridgework may not be worn into surgery.  Leave suitcase in the car. After surgery it may be brought to your room.  For patients admitted to the hospital, discharge time is determined by your treatment team.               Patients discharged the day of surgery will not be allowed to drive home.  Name and phone number of your driver: family  Special Instructions: N/A   Please read over the following fact sheets that you were given: Pain Booklet, Coughing and Deep Breathing, Surgical Site Infection Prevention, Anesthesia Post-op Instructions and Care and Recovery After Surgery Esophagogastroduodenoscopy Esophagogastroduodenoscopy (EGD) is a procedure to examine the lining of the esophagus, stomach, and first part of the small intestine (duodenum). A long, flexible, lighted tube with a camera attached (endoscope) is inserted down the throat to view these organs. This procedure is done to detect problems or abnormalities, such as inflammation, bleeding, ulcers, or growths, in order to treat them. The procedure lasts about 5-20 minutes. It is usually an outpatient procedure, but it may need to be performed in emergency cases in the hospital. LET YOUR CAREGIVER KNOW ABOUT:   Allergies  to food or medicine.  All medicines you are taking, including vitamins, herbs, eyedrops, and over-the-counter medicines and creams.  Use of steroids (by mouth or creams).  Previous problems you or members of your family have had with the use of anesthetics.  Any blood disorders you have.  Previous surgeries you have had.  Other health problems you have.  Possibility of pregnancy, if this applies. RISKS AND COMPLICATIONS  Generally, EGD is a safe procedure. However, as with any procedure, complications can occur. Possible complications include:  Infection.  Bleeding.  Tearing (perforation) of the esophagus, stomach, or duodenum.  Difficulty breathing or not being able to breath.  Excessive sweating.  Spasms of the larynx.  Slowed heartbeat.  Low blood pressure. BEFORE THE PROCEDURE  Do not eat or drink anything for 6-8 hours before the procedure or as directed by your caregiver.  Ask your caregiver about changing or stopping your regular medicines.  If you wear dentures, be prepared to remove them before the procedure.  Arrange for someone to drive you home after the procedure. PROCEDURE   A vein will be accessed to give medicines and fluids. A medicine to relax you (sedative) and a pain reliever will be given through that access into the vein.  A numbing medicine (local anesthetic) may be sprayed on your throat for comfort and to stop you from gagging or coughing.  A mouth guard may be placed in your mouth to protect your teeth and to keep you from biting on the endoscope.  You will be asked to lie on your left side.  The endoscope is inserted down your throat and into the esophagus, stomach, and duodenum.  Air is put through the endoscope to allow your caregiver to view the lining of your esophagus clearly.  The esophagus, stomach, and duodenum is then examined. During the exam, your caregiver may:  Remove tissue to be examined under a microscope (biopsy)  for inflammation, infection, or other medical problems.  Remove growths.  Remove objects (foreign bodies) that are stuck.  Treat any bleeding with medicines or other devices that stop tissues from bleeding (hot cautery, clipping devices).  Widen (dilate) or stretch narrowed areas of the esophagus and stomach.  The endoscope will then be withdrawn. AFTER THE PROCEDURE  You will be taken to a recovery area to be monitored. You will be able to go home once you are stable and alert.  Do not eat or drink anything until the local anesthetic and numbing medicines have worn off. You may choke.  It is normal to feel bloated, have pain with swallowing, or have a sore throat for a short time. This will wear off.  Your caregiver should be able to discuss his or her findings with you. It will take longer to discuss the test results if any biopsies were taken. Document Released: 10/17/2004 Document Revised: 10/31/2013 Document Reviewed: 05/19/2012 [facility] Patient Information 2015 [place], [place]. This information is not intended to replace advice given to you by your health care provider. Make sure you discuss any questions you have with your health care provider. Colonoscopy A colonoscopy is an exam to look at the entire large intestine (colon). This exam can help find problems such as tumors, polyps, inflammation, and areas of bleeding. The exam takes about 1 hour.  LET [facility] CARE PROVIDER KNOW ABOUT:   Any allergies you have.  All medicines you are taking, including vitamins, herbs, eye drops, creams, and over-the-counter medicines.  Previous problems you or members of your family have had with the use of anesthetics.  Any blood disorders you have.  Previous surgeries you have had.  Medical conditions you have. RISKS AND COMPLICATIONS  Generally, this is a safe procedure. However, as with any procedure, complications can occur. Possible complications include:  Bleeding.  Tearing  or rupture of the colon wall.  Reaction to medicines given during the exam.  Infection (rare). BEFORE THE PROCEDURE   Ask your health care provider about changing or stopping your regular medicines.  You may be prescribed an oral bowel prep. This involves drinking a large amount of medicated liquid, starting the day before your procedure. The liquid will cause you to have multiple loose stools until your stool is almost clear or light green. This cleans out your colon in preparation for the procedure.  Do not eat or drink anything else once you have started the bowel prep, unless your health care provider tells you it is safe to do so.  Arrange for someone to drive you home after the procedure. PROCEDURE   You will be given medicine to help you relax (sedative).  You will lie on your side with your knees bent.  A long, flexible tube with a light and camera on the end (colonoscope) will be inserted through the rectum and into the colon. The camera sends video back to a computer screen as it  moves through the colon. The colonoscope also releases carbon dioxide gas to inflate the colon. This helps your health care provider see the area better.  During the exam, your health care provider may take a small tissue sample (biopsy) to be examined under a microscope if any abnormalities are found.  The exam is finished when the entire colon has been viewed. AFTER THE PROCEDURE   Do not drive for 24 hours after the exam.  You may have a small amount of blood in your stool.  You may pass moderate amounts of gas and have mild abdominal cramping or bloating. This is caused by the gas used to inflate your colon during the exam.  Ask when your test results will be ready and how you will get your results. Make sure you get your test results. Document Released: 06/13/2000 Document Revised: 04/06/2013 Document Reviewed: 02/21/2013 [facility] Patient Information 2015 [place], [place]. This information is  not intended to replace advice given to you by your health care provider. Make sure you discuss any questions you have with your health care provider. PATIENT INSTRUCTIONS POST-ANESTHESIA  IMMEDIATELY FOLLOWING SURGERY:  Do not drive or operate machinery for the first twenty four hours after surgery.  Do not make any important decisions for twenty four hours after surgery or while taking narcotic pain medications or sedatives.  If you develop intractable nausea and vomiting or a severe headache please notify your doctor immediately.  FOLLOW-UP:  Please make an appointment with your surgeon as instructed. You do not need to follow up with anesthesia unless specifically instructed to do so.  WOUND CARE INSTRUCTIONS (if applicable):  Keep a dry clean dressing on the anesthesia/puncture wound site if there is drainage.  Once the wound has quit draining you may leave it open to air.  Generally you should leave the bandage intact for twenty four hours unless there is drainage.  If the epidural site drains for more than 36-48 hours please call the anesthesia department.  QUESTIONS?:  Please feel free to call your physician or the hospital operator if you have any questions, and they will be happy to assist you.

## 2014-11-16 ENCOUNTER — Telehealth: Payer: Self-pay | Admitting: Gastroenterology

## 2014-11-16 NOTE — Telephone Encounter (Signed)
PLEASE CALL PT. HIS LABS ARE NORMAL.

## 2014-11-21 ENCOUNTER — Ambulatory Visit (HOSPITAL_COMMUNITY): Payer: Medicare Other | Admitting: Anesthesiology

## 2014-11-21 ENCOUNTER — Ambulatory Visit (HOSPITAL_COMMUNITY)
Admission: RE | Admit: 2014-11-21 | Discharge: 2014-11-21 | Disposition: A | Discharge status: Home / Self Care | Payer: Medicare Other | Source: Ambulatory Visit | Attending: Gastroenterology | Admitting: Gastroenterology

## 2014-11-21 ENCOUNTER — Encounter (HOSPITAL_COMMUNITY)
Admission: RE | Disposition: A | Discharge status: Home / Self Care | Payer: Self-pay | Source: Ambulatory Visit | Attending: Gastroenterology

## 2014-11-21 ENCOUNTER — Encounter (HOSPITAL_COMMUNITY): Payer: Self-pay | Admitting: *Deleted

## 2014-11-21 DIAGNOSIS — M549 Dorsalgia, unspecified: Secondary | ICD-10-CM | POA: Diagnosis not present

## 2014-11-21 DIAGNOSIS — K3189 Other diseases of stomach and duodenum: Secondary | ICD-10-CM | POA: Insufficient documentation

## 2014-11-21 DIAGNOSIS — F419 Anxiety disorder, unspecified: Secondary | ICD-10-CM | POA: Insufficient documentation

## 2014-11-21 DIAGNOSIS — I252 Old myocardial infarction: Secondary | ICD-10-CM | POA: Insufficient documentation

## 2014-11-21 DIAGNOSIS — Z800 Family history of malignant neoplasm of digestive organs: Secondary | ICD-10-CM | POA: Insufficient documentation

## 2014-11-21 DIAGNOSIS — F17210 Nicotine dependence, cigarettes, uncomplicated: Secondary | ICD-10-CM | POA: Insufficient documentation

## 2014-11-21 DIAGNOSIS — G8929 Other chronic pain: Secondary | ICD-10-CM | POA: Diagnosis not present

## 2014-11-21 DIAGNOSIS — K2970 Gastritis, unspecified, without bleeding: Secondary | ICD-10-CM

## 2014-11-21 DIAGNOSIS — K648 Other hemorrhoids: Secondary | ICD-10-CM | POA: Insufficient documentation

## 2014-11-21 DIAGNOSIS — M1990 Unspecified osteoarthritis, unspecified site: Secondary | ICD-10-CM | POA: Insufficient documentation

## 2014-11-21 DIAGNOSIS — K219 Gastro-esophageal reflux disease without esophagitis: Secondary | ICD-10-CM | POA: Insufficient documentation

## 2014-11-21 DIAGNOSIS — Z1211 Encounter for screening for malignant neoplasm of colon: Secondary | ICD-10-CM | POA: Insufficient documentation

## 2014-11-21 DIAGNOSIS — D122 Benign neoplasm of ascending colon: Secondary | ICD-10-CM | POA: Insufficient documentation

## 2014-11-21 DIAGNOSIS — K2950 Unspecified chronic gastritis without bleeding: Secondary | ICD-10-CM | POA: Insufficient documentation

## 2014-11-21 DIAGNOSIS — K259 Gastric ulcer, unspecified as acute or chronic, without hemorrhage or perforation: Secondary | ICD-10-CM | POA: Insufficient documentation

## 2014-11-21 DIAGNOSIS — Z79899 Other long term (current) drug therapy: Secondary | ICD-10-CM | POA: Diagnosis not present

## 2014-11-21 DIAGNOSIS — I2510 Atherosclerotic heart disease of native coronary artery without angina pectoris: Secondary | ICD-10-CM | POA: Diagnosis not present

## 2014-11-21 DIAGNOSIS — Z79891 Long term (current) use of opiate analgesic: Secondary | ICD-10-CM | POA: Insufficient documentation

## 2014-11-21 DIAGNOSIS — K921 Melena: Secondary | ICD-10-CM

## 2014-11-21 DIAGNOSIS — D125 Benign neoplasm of sigmoid colon: Secondary | ICD-10-CM | POA: Diagnosis not present

## 2014-11-21 DIAGNOSIS — Z9049 Acquired absence of other specified parts of digestive tract: Secondary | ICD-10-CM | POA: Diagnosis not present

## 2014-11-21 DIAGNOSIS — D123 Benign neoplasm of transverse colon: Secondary | ICD-10-CM | POA: Insufficient documentation

## 2014-11-21 DIAGNOSIS — J45909 Unspecified asthma, uncomplicated: Secondary | ICD-10-CM | POA: Insufficient documentation

## 2014-11-21 DIAGNOSIS — R1013 Epigastric pain: Secondary | ICD-10-CM | POA: Diagnosis present

## 2014-11-21 HISTORY — PX: BIOPSY: SHX5522

## 2014-11-21 HISTORY — PX: ESOPHAGOGASTRODUODENOSCOPY (EGD) WITH PROPOFOL: SHX5813

## 2014-11-21 HISTORY — PX: COLONOSCOPY WITH PROPOFOL: SHX5780

## 2014-11-21 HISTORY — PX: POLYPECTOMY: SHX5525

## 2014-11-21 SURGERY — COLONOSCOPY WITH PROPOFOL
Anesthesia: Monitor Anesthesia Care

## 2014-11-21 MED ORDER — FENTANYL CITRATE (PF) 100 MCG/2ML IJ SOLN
25.0000 ug | INTRAMUSCULAR | Status: AC
  Administered 2014-11-21 (×2): 25 ug via INTRAVENOUS

## 2014-11-21 MED ORDER — MIDAZOLAM HCL 2 MG/2ML IJ SOLN
INTRAMUSCULAR | Status: AC
  Filled 2014-11-21: qty 2

## 2014-11-21 MED ORDER — ONDANSETRON HCL 4 MG/2ML IJ SOLN
4.0000 mg | Freq: Once | INTRAMUSCULAR | Status: AC
  Administered 2014-11-21: 4 mg via INTRAVENOUS

## 2014-11-21 MED ORDER — GLYCOPYRROLATE 0.2 MG/ML IJ SOLN
0.2000 mg | Freq: Once | INTRAMUSCULAR | Status: AC
  Administered 2014-11-21: 0.2 mg via INTRAVENOUS

## 2014-11-21 MED ORDER — STERILE WATER FOR IRRIGATION IR SOLN
Status: DC | PRN
  Administered 2014-11-21: 1000 mL

## 2014-11-21 MED ORDER — LACTATED RINGERS IV SOLN
INTRAVENOUS | Status: DC
  Administered 2014-11-21: 09:00:00 via INTRAVENOUS

## 2014-11-21 MED ORDER — FENTANYL CITRATE (PF) 100 MCG/2ML IJ SOLN: 25.0000 ug | INTRAMUSCULAR | Status: DC | PRN

## 2014-11-21 MED ORDER — PROPOFOL INFUSION 10 MG/ML OPTIME
INTRAVENOUS | Status: DC | PRN
  Administered 2014-11-21: 50 ug/kg/min via INTRAVENOUS
  Administered 2014-11-21: 10:00:00 via INTRAVENOUS
  Administered 2014-11-21: 100 ug/kg/min via INTRAVENOUS

## 2014-11-21 MED ORDER — ONDANSETRON HCL 4 MG/2ML IJ SOLN: 4.0000 mg | Freq: Once | INTRAMUSCULAR | Status: DC | PRN

## 2014-11-21 MED ORDER — MIDAZOLAM HCL 2 MG/2ML IJ SOLN
1.0000 mg | INTRAMUSCULAR | Status: DC | PRN
  Administered 2014-11-21: 2 mg via INTRAVENOUS

## 2014-11-21 MED ORDER — WATER FOR IRRIGATION, STERILE IR SOLN
Status: DC | PRN
  Administered 2014-11-21: 1000 mL

## 2014-11-21 MED ORDER — LIDOCAINE VISCOUS 2 % MT SOLN
3.0000 mL | Freq: Once | OROMUCOSAL | Status: AC
  Administered 2014-11-21: 3 mL via OROMUCOSAL

## 2014-11-21 MED ORDER — GLYCOPYRROLATE 0.2 MG/ML IJ SOLN
INTRAMUSCULAR | Status: AC
  Filled 2014-11-21: qty 1

## 2014-11-21 MED ORDER — PROPOFOL 10 MG/ML IV BOLUS
INTRAVENOUS | Status: AC
  Filled 2014-11-21: qty 20

## 2014-11-21 MED ORDER — ONDANSETRON HCL 4 MG/2ML IJ SOLN
INTRAMUSCULAR | Status: AC
  Filled 2014-11-21: qty 2

## 2014-11-21 MED ORDER — DICYCLOMINE HCL 10 MG PO CAPS: ORAL_CAPSULE | ORAL | Status: AC

## 2014-11-21 MED ORDER — LIDOCAINE VISCOUS 2 % MT SOLN
OROMUCOSAL | Status: AC
  Filled 2014-11-21: qty 15

## 2014-11-21 MED ORDER — MIDAZOLAM HCL 5 MG/5ML IJ SOLN
INTRAMUSCULAR | Status: DC | PRN
  Administered 2014-11-21: 2 mg via INTRAVENOUS

## 2014-11-21 MED ORDER — FENTANYL CITRATE (PF) 100 MCG/2ML IJ SOLN
INTRAMUSCULAR | Status: AC
  Filled 2014-11-21: qty 2

## 2014-11-21 SURGICAL SUPPLY — 25 items
BLOCK BITE 60FR ADLT L/F BLUE (MISCELLANEOUS) ×2 IMPLANT
ELECT REM PT RETURN 9FT ADLT (ELECTROSURGICAL)
ELECTRODE REM PT RTRN 9FT ADLT (ELECTROSURGICAL) IMPLANT
FCP BXJMBJMB 240X2.8X (CUTTING FORCEPS)
FLOOR PAD 36X40 (MISCELLANEOUS) ×3
FORCEPS BIOP RAD 4 LRG CAP 4 (CUTTING FORCEPS) ×4 IMPLANT
FORCEPS BIOP RJ4 240 W/NDL (CUTTING FORCEPS)
FORCEPS BXJMBJMB 240X2.8X (CUTTING FORCEPS) IMPLANT
FORMALIN 10 PREFIL 20ML (MISCELLANEOUS) ×10 IMPLANT
INJECTOR/SNARE I SNARE (MISCELLANEOUS) IMPLANT
KIT ENDO PROCEDURE PEN (KITS) ×3 IMPLANT
MANIFOLD NEPTUNE II (INSTRUMENTS) ×3 IMPLANT
NDL SCLEROTHERAPY 25GX240 (NEEDLE) IMPLANT
NEEDLE SCLEROTHERAPY 25GX240 (NEEDLE) IMPLANT
OVERTUBE ENDOCUFF GREEN (MISCELLANEOUS) ×3 IMPLANT
PAD FLOOR 36X40 (MISCELLANEOUS) ×1 IMPLANT
PROBE APC STR FIRE (PROBE) IMPLANT
PROBE INJECTION GOLD (MISCELLANEOUS)
PROBE INJECTION GOLD 7FR (MISCELLANEOUS) IMPLANT
ROTH PLATINUM NET UNIVERSAL (MISCELLANEOUS) ×2 IMPLANT
SNARE SHORT THROW 13M SML OVAL (MISCELLANEOUS) ×5 IMPLANT
SYR INFLATION 60ML (SYRINGE) IMPLANT
TRAP SPECIMEN MUCOUS 40CC (MISCELLANEOUS) ×2 IMPLANT
TUBING IRRIGATION ENDOGATOR (MISCELLANEOUS) ×2 IMPLANT
WATER STERILE IRR 1000ML POUR (IV SOLUTION) ×2 IMPLANT

## 2014-11-21 NOTE — Anesthesia Postprocedure Evaluation (Signed)
  Anesthesia Post-op Note  Patient: Vincent Hudson  Procedure(s) Performed: Procedure(s): COLONOSCOPY WITH PROPOFOL (procedure #1); IN CECUM AT 0935; WITHDRAWAL TIME 27 minutes (N/A) ESOPHAGOGASTRODUODENOSCOPY (EGD) WITH PROPOFOL (procedure #2) (N/A) POLYPECTOMY (N/A) BIOPSY (N/A)  Patient Location: PACU  Anesthesia Type:MAC  Level of Consciousness: awake, alert , oriented and patient cooperative  Airway and Oxygen Therapy: Patient Spontanous Breathing  Post-op Pain: none  Post-op Assessment: Post-op Vital signs reviewed, Patient's Cardiovascular Status Stable, Respiratory Function Stable, Patent Airway and No signs of Nausea or vomiting  Post-op Vital Signs: Reviewed and stable  Last Vitals:  Filed Vitals:   11/21/14 0905  BP: 147/82  Temp:   Resp: 21    Complications: No apparent anesthesia complications

## 2014-11-21 NOTE — Progress Notes (Signed)
REVIEWED-NO ADDITIONAL RECOMMENDATIONS. 

## 2014-11-21 NOTE — Discharge Instructions (Signed)
NO OBVIOUS SOURCE FOR YOUR DIARRHEA WAS IDENTIFIED. YOUR DIARRHEA & abdominal paon IS MOST LIKELY DUE TO YOUR NOT HAVING A GALLBLADDER, IRRITABLE BOWEL SYNDROME AND GASTRITIS. YOU HAD 9 POLYPS POLYPS REMOVED. YOU HAVE LARGE INTERNAL HEMORRHOIDS. I BIOPSIED YOUR COLON, AND STOMACH.    FOLLOW A LOW FAT/HIGH FIBER DIET. AVOID ITEMS THAT CAUSE BLOATING. SEE INFO BELOW.   TAKE DICYCLOMINE 30 MINUTES PRIOR TO BREAKFAST AND LUNCH. IT MAY CAUSE DROWSINESS, DRY EYES/MOUTH, BLURRY VISION, OR DIFFICULTY URINATING.  CONTINUE OMEPRAZOLE ONE 30 MINUTES PRIOR TO YOUR MEALS AT LEAST ONCE A DAY.  YOUR BIOPSY RESULTS WILL BE AVAILABLE IN MY CHART AFTER MAY 26 AND MY OFFICE WILL CONTACT YOU IN 10-14 DAYS WITH YOUR RESULTS.   Follow up in SEP 2016.  NEXT COLONOSCOPY IN 1-3 YEARS. YOUR SISTERS, BROTHERS, CHILDREN, AND PARENTS NEED TO HAVE A COLONOSCOPY STARTING AT THE AGE OF 40.       ENDOSCOPY Care After Read the instructions outlined below and refer to this sheet in the next week. These discharge instructions provide you with general information on caring for yourself after you leave the hospital. While your treatment has been planned according to the most current medical practices available, unavoidable complications occasionally occur. If you have any problems or questions after discharge, call DR. Sheddrick Lattanzio, [phone].  ACTIVITY  You may resume your regular activity, but move at a slower pace for the next 24 hours.   Take frequent rest periods for the next 24 hours.   Walking will help get rid of the air and reduce the bloated feeling in your belly (abdomen).   No driving for 24 hours (because of the medicine (anesthesia) used during the test).   You may shower.   Do not sign any important legal documents or operate any machinery for 24 hours (because of the anesthesia used during the test).    NUTRITION  Drink plenty of fluids.   You may resume your normal diet as instructed by your  doctor.   Begin with a light meal and progress to your normal diet. Heavy or fried foods are harder to digest and may make you feel sick to your stomach (nauseated).   Avoid alcoholic beverages for 24 hours or as instructed.    MEDICATIONS  You may resume your normal medications.   WHAT YOU CAN EXPECT TODAY  Some feelings of bloating in the abdomen.   Passage of more gas than usual.   Spotting of blood in your stool or on the toilet paper  .  IF YOU HAD POLYPS REMOVED DURING THE ENDOSCOPY:  Eat a soft diet IF YOU HAVE NAUSEA, BLOATING, ABDOMINAL PAIN, OR VOMITING.    FINDING OUT THE RESULTS OF YOUR TEST Not all test results are available during your visit. DR. Oneida Alar WILL CALL YOU WITHIN 14 DAYS OF YOUR PROCEDUE WITH YOUR RESULTS. Do not assume everything is normal if you have not heard from DR. Ruari Mudgett, CALL HER OFFICE AT [phone].  SEEK IMMEDIATE MEDICAL ATTENTION AND CALL THE OFFICE: [phone] IF:  You have more than a spotting of blood in your stool.   Your belly is swollen (abdominal distention).   You are nauseated or vomiting.   You have a temperature over 101F.   You have abdominal pain or discomfort that is severe or gets worse throughout the day.  Low-Fat Diet BREADS, CEREALS, PASTA, RICE, DRIED PEAS, AND BEANS These products are high in carbohydrates and most are low in fat. Therefore, they can be increased  in the diet as substitutes for fatty foods. They too, however, contain calories and should not be eaten in excess. Cereals can be eaten for snacks as well as for breakfast.   FRUITS AND VEGETABLES It is good to eat fruits and vegetables. Besides being sources of fiber, both are rich in vitamins and some minerals. They help you get the daily allowances of these nutrients. Fruits and vegetables can be used for snacks and desserts.  MEATS Limit lean meat, chicken, [place], and fish to no more than 6 ounces per day. Beef, Pork, and Lamb Use lean cuts  of beef, pork, and lamb. Lean cuts include:  Extra-lean ground beef.  Arm roast.  Sirloin tip.  Center-cut ham.  Round steak.  Loin chops.  Rump roast.  Tenderloin.  Trim all fat off the outside of meats before cooking. It is not necessary to severely decrease the intake of red meat, but lean choices should be made. Lean meat is rich in protein and contains a highly absorbable form of iron. Premenopausal women, in particular, should avoid reducing lean red meat because this could increase the risk for low red blood cells (iron-deficiency anemia).  Chicken and [place] These are good sources of protein. The fat of poultry can be reduced by removing the skin and underlying fat layers before cooking. Chicken and [place] can be substituted for lean red meat in the diet. Poultry should not be fried or covered with high-fat sauces. Fish and Shellfish Fish is a good source of protein. Shellfish contain cholesterol, but they usually are low in saturated fatty acids. The preparation of fish is important. Like chicken and [place], they should not be fried or covered with high-fat sauces. EGGS Egg whites contain no fat or cholesterol. They can be eaten often. Try 1 to 2 egg whites instead of whole eggs in recipes or use egg substitutes that do not contain yolk. MILK AND DAIRY PRODUCTS Use skim or 1% milk instead of 2% or whole milk. Decrease whole milk, natural, and processed cheeses. Use nonfat or low-fat (2%) cottage cheese or low-fat cheeses made from vegetable oils. Choose nonfat or low-fat (1 to 2%) yogurt. Experiment with evaporated skim milk in recipes that call for heavy cream. Substitute low-fat yogurt or low-fat cottage cheese for sour cream in dips and salad dressings. Have at least 2 servings of low-fat dairy products, such as 2 glasses of skim (or 1%) milk each day to help get your daily calcium intake. FATS AND OILS Reduce the total intake of fats, especially saturated fat. Butterfat, lard, and  beef fats are high in saturated fat and cholesterol. These should be avoided as much as possible. Vegetable fats do not contain cholesterol, but certain vegetable fats, such as coconut oil, palm oil, and palm kernel oil are very high in saturated fats. These should be limited. These fats are often used in bakery goods, processed foods, popcorn, oils, and nondairy creamers. Vegetable shortenings and some peanut butters contain hydrogenated oils, which are also saturated fats. Read the labels on these foods and check for saturated vegetable oils. Unsaturated vegetable oils and fats do not raise blood cholesterol. However, they should be limited because they are fats and are high in calories. Total fat should still be limited to 30% of your daily caloric intake. Desirable liquid vegetable oils are corn oil, cottonseed oil, olive oil, canola oil, safflower oil, soybean oil, and sunflower oil. Peanut oil is not as good, but small amounts are acceptable. Buy a heart-healthy tub  margarine that has no partially hydrogenated oils in the ingredients. Mayonnaise and salad dressings often are made from unsaturated fats, but they should also be limited because of their high calorie and fat content. Seeds, nuts, peanut butter, olives, and avocados are high in fat, but the fat is mainly the unsaturated type. These foods should be limited mainly to avoid excess calories and fat. OTHER EATING TIPS Snacks  Most sweets should be limited as snacks. They tend to be rich in calories and fats, and their caloric content outweighs their nutritional value. Some good choices in snacks are graham crackers, melba toast, soda crackers, bagels (no egg), English muffins, fruits, and vegetables. These snacks are preferable to snack crackers, [place] fries, TORTILLA CHIPS, and POTATO chips. Popcorn should be air-popped or cooked in small amounts of liquid vegetable oil. Desserts Eat fruit, low-fat yogurt, and fruit ices instead of pastries,  cake, and cookies. Sherbet, angel food cake, gelatin dessert, frozen low-fat yogurt, or other frozen products that do not contain saturated fat (pure fruit juice bars, frozen ice pops) are also acceptable.  COOKING METHODS Choose those methods that use little or no fat. They include: Poaching.  Braising.  Steaming.  Grilling.  Baking.  Stir-frying.  Broiling.  Microwaving.  Foods can be cooked in a nonstick pan without added fat, or use a nonfat cooking spray in regular cookware. Limit fried foods and avoid frying in saturated fat. Add moisture to lean meats by using water, broth, cooking wines, and other nonfat or low-fat sauces along with the cooking methods mentioned above. Soups and stews should be chilled after cooking. The fat that forms on top after a few hours in the refrigerator should be skimmed off. When preparing meals, avoid using excess salt. Salt can contribute to raising blood pressure in some people.  EATING AWAY FROM HOME Order entres, potatoes, and vegetables without sauces or butter. When meat exceeds the size of a deck of cards (3 to 4 ounces), the rest can be taken home for another meal. Choose vegetable or fruit salads and ask for low-calorie salad dressings to be served on the side. Use dressings sparingly. Limit high-fat toppings, such as bacon, crumbled eggs, cheese, sunflower seeds, and olives. Ask for heart-healthy tub margarine instead of butter.  High-Fiber Diet A high-fiber diet changes your normal diet to include more whole grains, legumes, fruits, and vegetables. Changes in the diet involve replacing refined carbohydrates with unrefined foods. The calorie level of the diet is essentially unchanged. The Dietary Reference Intake (recommended amount) for adult males is 38 grams per day. For adult females, it is 25 grams per day. Pregnant and lactating women should consume 28 grams of fiber per day. Fiber is the intact part of a plant that is not broken down during  digestion. Functional fiber is fiber that has been isolated from the plant to provide a beneficial effect in the body. PURPOSE  Increase stool bulk.   Ease and regulate bowel movements.   Lower cholesterol.  INDICATIONS THAT YOU NEED MORE FIBER  Constipation and hemorrhoids.   Uncomplicated diverticulosis (intestine condition) and irritable bowel syndrome.   Weight management.   REDUCE YOUR RISK FOR COLON CANCER  GUIDELINES FOR INCREASING FIBER IN THE DIET  Start adding fiber to the diet slowly. A gradual increase of about 5 more grams (2 slices of whole-wheat bread, 2 servings of most fruits or vegetables, or 1 bowl of high-fiber cereal) per day is best. Too rapid an increase in fiber may result  in constipation, flatulence, and bloating.   Drink enough water and fluids to keep your urine clear or pale yellow. Water, juice, or caffeine-free drinks are recommended. Not drinking enough fluid may cause constipation.   Eat a variety of high-fiber foods rather than one type of fiber.   Try to increase your intake of fiber through using high-fiber foods rather than fiber pills or supplements that contain small amounts of fiber.   The goal is to change the types of food eaten. Do not supplement your present diet with high-fiber foods, but replace foods in your present diet.  INCLUDE A VARIETY OF FIBER SOURCES  Replace refined and processed grains with whole grains, canned fruits with fresh fruits, and incorporate other fiber sources. White rice, white breads, and most bakery goods contain little or no fiber.   Brown whole-grain rice, buckwheat oats, and many fruits and vegetables are all good sources of fiber. These include: broccoli, Brussels sprouts, cabbage, cauliflower, beets, sweet potatoes, white potatoes (skin on), carrots, tomatoes, eggplant, squash, berries, fresh fruits, and dried fruits.   Cereals appear to be the richest source of fiber. Cereal fiber is found in whole grains  and bran. Bran is the fiber-rich outer coat of cereal grain, which is largely removed in refining. In whole-grain cereals, the bran remains. In breakfast cereals, the largest amount of fiber is found in those with "bran" in their names. The fiber content is sometimes indicated on the label.   You may need to include additional fruits and vegetables each day.   In baking, for 1 cup white flour, you may use the following substitutions:   1 cup whole-wheat flour minus 2 tablespoons.   1/2 cup white flour plus 1/2 cup whole-wheat flour.  Gastritis  Gastritis is an inflammation (the body's way of reacting to injury and/or infection) of the stomach. It is often caused by viral or bacterial (germ) infections. It can also be caused BY ASPIRIN, BC/GOODY POWDER'S, (IBUPROFEN) MOTRIN, OR ALEVE (NAPROXEN), chemicals (including alcohol), SPICY FOODS, and medications. This illness may be associated with generalized malaise (feeling tired, not well), UPPER ABDOMINAL STOMACH cramps, and fever. One common bacterial cause of gastritis is an organism known as H. Pylori. This can be treated with antibiotics.   Hemorrhoids Hemorrhoids are dilated (enlarged) veins around the rectum. Sometimes clots will form in the veins. This makes them swollen and painful. These are called thrombosed hemorrhoids. Causes of hemorrhoids include:  Constipation.   Straining to have a bowel movement.   HEAVY LIFTING HOME CARE INSTRUCTIONS  Eat a well balanced diet and drink 6 to 8 glasses of water every day to avoid constipation. You may also use a bulk laxative.   Avoid straining to have bowel movements.   Keep anal area dry and clean.   Do not use a donut shaped pillow or sit on the toilet for long periods. This increases blood pooling and pain.   Move your bowels when your body has the urge; this will require less straining and will decrease pain and pressure.   Polyps, Colon  A polyp is extra tissue that grows inside  your body. Colon polyps grow in the large intestine. The large intestine, also called the colon, is part of your digestive system. It is a long, hollow tube at the end of your digestive tract where your body makes and stores stool. Most polyps are not dangerous. They are benign. This means they are not cancerous. But over time, some types of polyps can  turn into cancer. Polyps that are smaller than a pea are usually not harmful. But larger polyps could someday become or may already be cancerous. To be safe, doctors remove all polyps and test them.   TREATMENT  The caregiver will remove the polyp during sigmoidoscopy or colonoscopy.  PREVENTION There is not one sure way to prevent polyps. You might be able to lower your risk of getting them if you:  Eat more fruits and vegetables and less fatty food.   Do not smoke.   Avoid alcohol.   Exercise every day.   Lose weight if you are overweight.   Eating more calcium and folate can also lower your risk of getting polyps. Some foods that are rich in calcium are milk, cheese, and broccoli. Some foods that are rich in folate are chickpeas, kidney beans, and spinach.

## 2014-11-21 NOTE — Op Note (Signed)
[facility] [address] [place], [address]   ENDOSCOPY PROCEDURE REPORT  PATIENT: Vincent Hudson, Vincent Hudson  MR#: [id number] BIRTHDATE: [birth year] , 59  yrs. old GENDER: male  ENDOSCOPIST: Danie Binder, MD REFERRED AV:WPVXYIA Cindie Laroche, M.D. PROCEDURE DATE: 28-Nov-2014 PROCEDURE:   EGD w/ biopsy  INDICATIONS:dyspepsia. MEDICATIONS: Monitored anesthesia care TOPICAL ANESTHETIC:   Viscous Xylocaine ASA CLASS:  DESCRIPTION OF PROCEDURE:     Physical exam was performed.  Informed consent was obtained from the patient after explaining the benefits, risks, and alternatives to the procedure.  The patient was connected to the monitor and placed in the left lateral position.  Continuous oxygen was provided by nasal cannula and IV medicine administered through an indwelling cannula.  After administration of sedation, the patients esophagus was intubated and the     endoscope was advanced under direct visualization to the second portion of the duodenum.  The scope was removed slowly by carefully examining the color, texture, anatomy, and integrity of the mucosa on the way out.  The patient was recovered in endoscopy and discharged home in satisfactory condition.    ESOPHAGUS: The mucosa of the esophagus appeared normal.   STOMACH: 5-6 CM GASTRIC REMNANT. Mild non-erosive gastritis (inflammation) was found in the gastric fundus.  Multiple biopsies were performed using cold forceps.   TWO 1 mm anastomotic ulcers-CLEAN BASED. OTHERWISE NORMAL BILROTH II ANATOMY. COMPLICATIONS: There were no immediate complications.  ENDOSCOPIC IMPRESSION: 1.   DYSPEPSIA MOST LIKELY DUE TO IBS, GASTRITIS, AND NOT HAVING A GALLBLADDER 2.   MILD Non-erosive gastritis 3.   TWO 1 mm anastomotic ulcers  RECOMMENDATIONS: FOLLOW A LOW FAT/HIGH FIBER DIET. DICYCLOMINE 30 MINUTES PRIOR TO BREAKFAST AND LUNCH. CONTINUE OMEPRAZOLE ONE 30 MINUTES PRIOR TO A MEAL AT LEAST ONCE A DAY. AWAIT BIOPSY  RESULTS. Follow up in SEP 2016.  REPEAT EXAM: eSigned:  Danie Binder, MD 11-28-2014 10:43 AM     CPT CODES: ICD CODES:  The ICD and CPT codes recommended by this software are interpretations from the data that the clinical staff has captured with the software.  The verification of the translation of this report to the ICD and CPT codes and modifiers is the sole responsibility of the health care institution and practicing physician where this report was generated.  [employer] will not be held responsible for the validity of the ICD and CPT codes included on this report.  AMA assumes no liability for data contained or not contained herein. C[employer] is a Designer, television/film set of the [employer].

## 2014-11-21 NOTE — H&P (Signed)
Primary Care Physician:  Maricela Curet, MD Primary Gastroenterologist:  Dr. Oneida Alar  Pre-Procedure History & Physical: HPI:  Vincent Hudson is a 59 y.o. male here for COLON CANCER SCREENING/DYSPEPSIA.   Past Medical History  Diagnosis Date  . History of stomach ulcers   . Arthritis   . Chronic back pain   . PUD (peptic ulcer disease)     bleeding; per patient multiple times in his past.  . Myocardial infarction 2006  . GERD (gastroesophageal reflux disease)   . Asthma   . Degenerative disc disease, cervical     and lumbar  . Anxiety     Past Surgical History  Procedure Laterality Date  . Cholecystectomy    . Abdominal surgery      "for ulcers"  . Leg surgery Left     "drew fluid out of my leg for infectious arthritis".    Prior to Admission medications   Medication Sig Start Date End Date Taking? Authorizing Provider  albuterol (PROVENTIL HFA;VENTOLIN HFA) 108 (90 BASE) MCG/ACT inhaler Inhale 2 puffs into the lungs every 6 (six) hours as needed. For shortness of breath   Yes Historical Provider, MD  ALPRAZolam (XANAX) 0.5 MG tablet Take 0.5 mg by mouth 3 (three) times daily as needed. For anxiety and nervous stomach   Yes Historical Provider, MD  omeprazole (PRILOSEC) 20 MG capsule Take 20 mg by mouth 2 (two) times daily.   Yes Historical Provider, MD  oxyCODONE-acetaminophen (PERCOCET/ROXICET) 5-325 MG per tablet Take 1 tablet by mouth every 4 (four) hours as needed for pain.   Yes Historical Provider, MD  polyethylene glycol-electrolytes (NULYTELY/GOLYTELY) 420 G solution Take 4,000 mLs by mouth once. 10/31/14  Yes Carlis Stable, NP    Allergies as of 10/31/2014 - Review Complete 10/31/2014  Allergen Reaction Noted  . Penicillins Anaphylaxis and Rash 04/01/2013  . Aspirin Other (See Comments)   . Nsaids Other (See Comments) 02/13/2012    Family History  Problem Relation Age of Onset  . Colon cancer Maternal Uncle   . Colon cancer Paternal Uncle     History    Social History  . Marital Status: Divorced    Spouse Name: N/A  . Number of Children: N/A  . Years of Education: N/A   Occupational History  . Not on file.   Social History Main Topics  . Smoking status: Current Every Day Smoker -- 0.75 packs/day for 40 years    Types: Cigarettes  . Smokeless tobacco: Not on file  . Alcohol Use: 7.2 oz/week    0 Standard drinks or equivalent, 12 Cans of beer per week     Comment: 12 pack a week  . Drug Use: No  . Sexual Activity: Yes    Birth Control/ Protection: None   Other Topics Concern  . Not on file   Social History Narrative    Review of Systems: See HPI, otherwise negative ROS   Physical Exam: Temp(Src) 97.4 F (36.3 C)  Ht 5\' 11"  (1.803 m)  Wt 199 lb (90.266 kg)  BMI 27.77 kg/m2 General:   Alert,  pleasant and cooperative in NAD Head:  Normocephalic and atraumatic. Neck:  Supple; Lungs:  Clear throughout to auscultation.    Heart:  Regular rate and rhythm. Abdomen:  Soft, nontender and nondistended. Normal bowel sounds, without guarding, and without rebound.   Neurologic:  Alert and  oriented x4;  grossly normal neurologically.  Impression/Plan:    SCREENING/dyspepsia  Plan:  1. TCS/EGD TODAY

## 2014-11-21 NOTE — Anesthesia Preprocedure Evaluation (Signed)
Anesthesia Evaluation  Patient identified by MRN, date of birth, ID band Patient awake    Reviewed: Allergy & Precautions, NPO status , Patient's Chart, lab work & pertinent test results  Airway Mallampati: II  TM Distance: >3 FB     Dental  (+) Poor Dentition   Pulmonary asthma , Current Smoker,  breath sounds clear to auscultation        Cardiovascular + CAD and + Past MI Rhythm:Regular Rate:Normal     Neuro/Psych PSYCHIATRIC DISORDERS Anxiety    GI/Hepatic PUD, GERD-  Medicated and Poorly Controlled,  Endo/Other    Renal/GU      Musculoskeletal   Abdominal   Peds  Hematology   Anesthesia Other Findings   Reproductive/Obstetrics                             Anesthesia Physical Anesthesia Plan  ASA: III  Anesthesia Plan: MAC   Post-op Pain Management:    Induction: Intravenous  Airway Management Planned: Simple Face Mask  Additional Equipment:   Intra-op Plan:   Post-operative Plan:   Informed Consent: I have reviewed the patients History and Physical, chart, labs and discussed the procedure including the risks, benefits and alternatives for the proposed anesthesia with the patient or authorized representative who has indicated his/her understanding and acceptance.     Plan Discussed with:   Anesthesia Plan Comments:         Anesthesia Quick Evaluation

## 2014-11-21 NOTE — Transfer of Care (Signed)
Immediate Anesthesia Transfer of Care Note  Patient: Vincent Hudson  Procedure(s) Performed: Procedure(s): COLONOSCOPY WITH PROPOFOL (procedure #1); IN CECUM AT 0935; WITHDRAWAL TIME 27 minutes (N/A) ESOPHAGOGASTRODUODENOSCOPY (EGD) WITH PROPOFOL (procedure #2) (N/A) POLYPECTOMY (N/A) BIOPSY (N/A)  Patient Location: PACU  Anesthesia Type:MAC  Level of Consciousness: awake, alert  and patient cooperative  Airway & Oxygen Therapy: Patient Spontanous Breathing and Patient connected to face mask oxygen  Post-op Assessment: Report given to RN, Post -op Vital signs reviewed and stable and Patient moving all extremities  Post vital signs: Reviewed and stable  Last Vitals:  Filed Vitals:   11/21/14 0905  BP: 147/82  Temp:   Resp: 21    Complications: No apparent anesthesia complications

## 2014-11-21 NOTE — Op Note (Signed)
[facility] [address] [place], [address]   COLONOSCOPY PROCEDURE REPORT  PATIENT: Vincent, Hudson  MR#: [id number] BIRTHDATE: 1955-12-07 , 59  yrs. old GENDER: male ENDOSCOPIST: Danie Binder, MD REFERRED EL:[id number] Cindie Laroche, M.D. PROCEDURE DATE:  Dec 19, 2014 PROCEDURE:   Colonoscopy with cold biopsy polypectomy and Colonoscopy with snare polypectomy INDICATIONS:hematochezia and LOWER ABDOMINAL PAIN. MEDICATIONS: Monitored anesthesia care  DESCRIPTION OF PROCEDURE:    Physical exam was performed.  Informed consent was obtained from the patient after explaining the benefits, risks, and alternatives to procedure.  The patient was connected to monitor and placed in left lateral position. Continuous oxygen was provided by nasal cannula and IV medicine administered through an indwelling cannula.  After administration of sedation and rectal exam, the patients rectum was intubated and the     colonoscope was advanced under direct visualization to the cecum.  The scope was removed slowly by carefully examining the color, texture, anatomy, and integrity mucosa on the way out.  The patient was recovered in endoscopy and discharged home in satisfactory condition.     COLON FINDINGS: Seven sessile polyps ranging from 2 to 64mm in size were found in the ascending colon and transverse colon.  A polypectomy was performed with cold forceps.  , Two sessile polyps ranging from 8 to 66mm in size were found in the sigmoid colon.  A polypectomy was performed using snare cautery.  , and The colon was redundant.  Manual abdominal counter-pressure was used to reach the cecum.  PREP QUALITY: good. CECAL W/D TIME: 17       minutes COMPLICATIONS: None  ENDOSCOPIC IMPRESSION: 1.   NINE COLON polyps REMOVED 2.   The LEFT colon IS redundant 3.   LARGE INTERNAL HEMORRHOIDS  RECOMMENDATIONS: FOLLOW A LOW FAT/HIGH FIBER DIET. DICYCLOMINE 30 MINUTES PRIOR TO BREAKFAST AND  LUNCH. OMEPRAZOLE ONE 30 MINUTES PRIOR TO MEALS AT LEAST ONCE A DAY. AWAIT BIOPSY RESULTS. Follow up in SEP 2016.     _______________________________ eSigned:  Danie Binder, MD December 19, 2014 10:37 AM    CPT CODES: ICD CODES:  The ICD and CPT codes recommended by this software are interpretations from the data that the clinical staff has captured with the software.  The verification of the translation of this report to the ICD and CPT codes and modifiers is the sole responsibility of the health care institution and practicing physician where this report was generated.  [place]. will not be held responsible for the validity of the ICD and CPT codes included on this report.  AMA assumes no liability for data contained or not contained herein. CPT is a Designer, television/film set of the [employer].

## 2014-11-22 ENCOUNTER — Encounter (HOSPITAL_COMMUNITY): Payer: Self-pay | Admitting: Gastroenterology

## 2014-12-06 ENCOUNTER — Telehealth: Payer: Self-pay | Admitting: Gastroenterology

## 2014-12-06 NOTE — Telephone Encounter (Signed)
OV MADE AND REMINDER IN EPIC

## 2014-12-06 NOTE — Telephone Encounter (Signed)
Please call pt. HE had simple adenomas removed from HIS colon.  His stomach Bx shows mild gastritis.    FOLLOW A LOW FAT/HIGH FIBER DIET. AVOID ITEMS THAT CAUSE BLOATING.  TAKE DICYCLOMINE 30 MINUTES PRIOR TO BREAKFAST AND LUNCH. IT MAY CAUSE DROWSINESS, DRY EYES/MOUTH, BLURRY VISION, OR DIFFICULTY URINATING. CONTINUE OMEPRAZOLE ONE 30 MINUTES PRIOR TO YOUR MEALS AT LEAST ONCE A DAY.  Follow up in SEP 2016 E30 GASTRITIS, LOWER ABDOMINAL PAIN.  NEXT COLONOSCOPY IN 3 YEARS. YOUR SISTERS, BROTHERS, CHILDREN, AND PARENTS NEED TO HAVE A COLONOSCOPY STARTING AT THE AGE OF 40.

## 2014-12-07 NOTE — Telephone Encounter (Signed)
Tried to call and VM not set up.  

## 2014-12-08 NOTE — Telephone Encounter (Signed)
Letter mailed to pt to call for results.  

## 2014-12-13 ENCOUNTER — Telehealth: Payer: Self-pay

## 2014-12-13 NOTE — Telephone Encounter (Signed)
Pt is aware of results. He said that his stomach is still swelling and is not sure what is causing it. Please advise

## 2014-12-18 NOTE — Telephone Encounter (Signed)
Tried to call with no answer  

## 2014-12-18 NOTE — Telephone Encounter (Signed)
PLEASE CALL PT. HIS BLOATING IS MOST LIKELY DUE TO HIS IBS AND NOT HAVING A GALLBLADDER. HE SHOULD:  STRICTLY ADHERE TO A LOW FAT/HIGH FIBER DIET. AVOID ITEMS THAT CAUSE BLOATING.   TAKE DICYCLOMINE 30 MINUTES PRIOR TO BREAKFAST AND LUNCH. IT MAY CAUSE DROWSINESS, DRY EYES/MOUTH, BLURRY VISION, OR DIFFICULTY URINATING. CONTINUE OMEPRAZOLE ONE 30 MINUTES PRIOR TO YOUR MEALS AT LEAST ONCE A DAY.  HE SHOULD TAKE A PROBIOTIC DAILY FOR THREE MONTHS ([place], [place]). IF HIS SYMPTOM DO NOT IMPROVE BY SEP 1, HE WILL NEED ADDITIONAL EVALUATION( HYDROGEN BREATH TEST FOR SIBO). HE SHOULD CALL THE [facility] IF HIS SYM ARE NOT BETTER BY SEP 1.

## 2014-12-20 NOTE — Telephone Encounter (Signed)
Tried to call with no answer. Will mail out letter for him to call us

## 2015-03-13 ENCOUNTER — Encounter: Payer: Self-pay | Admitting: Nurse Practitioner

## 2015-03-13 ENCOUNTER — Ambulatory Visit: Payer: Medicare Other | Admitting: Nurse Practitioner

## 2015-03-13 ENCOUNTER — Telehealth: Payer: Self-pay | Admitting: Nurse Practitioner

## 2015-03-13 NOTE — Telephone Encounter (Signed)
PATIENT WAS A NO SHOW AND LETTER SENT  °

## 2015-03-13 NOTE — Telephone Encounter (Signed)
Noted  

## 2015-05-22 ENCOUNTER — Other Ambulatory Visit (HOSPITAL_COMMUNITY): Payer: Self-pay | Admitting: Family Medicine

## 2015-05-22 ENCOUNTER — Ambulatory Visit (HOSPITAL_COMMUNITY)
Admission: RE | Admit: 2015-05-22 | Discharge: 2015-05-22 | Disposition: A | Discharge status: Home / Self Care | Payer: Medicare Other | Source: Ambulatory Visit | Attending: Family Medicine | Admitting: Family Medicine

## 2015-05-22 DIAGNOSIS — R079 Chest pain, unspecified: Secondary | ICD-10-CM | POA: Insufficient documentation

## 2015-05-22 DIAGNOSIS — J690 Pneumonitis due to inhalation of food and vomit: Secondary | ICD-10-CM | POA: Insufficient documentation

## 2015-05-22 DIAGNOSIS — R918 Other nonspecific abnormal finding of lung field: Secondary | ICD-10-CM | POA: Insufficient documentation

## 2015-05-22 DIAGNOSIS — R05 Cough: Secondary | ICD-10-CM | POA: Insufficient documentation

## 2015-05-22 DIAGNOSIS — R6883 Chills (without fever): Secondary | ICD-10-CM | POA: Diagnosis not present

## 2015-05-22 DIAGNOSIS — J449 Chronic obstructive pulmonary disease, unspecified: Secondary | ICD-10-CM | POA: Insufficient documentation

## 2015-08-08 ENCOUNTER — Emergency Department (HOSPITAL_COMMUNITY): Payer: Medicare Other

## 2015-08-08 ENCOUNTER — Emergency Department (HOSPITAL_COMMUNITY)
Admission: EM | Admit: 2015-08-08 | Discharge: 2015-08-08 | Disposition: A | Discharge status: Home / Self Care | Payer: Medicare Other | Attending: Emergency Medicine | Admitting: Emergency Medicine

## 2015-08-08 ENCOUNTER — Encounter (HOSPITAL_COMMUNITY): Payer: Self-pay | Admitting: Emergency Medicine

## 2015-08-08 DIAGNOSIS — Z880 Allergy status to penicillin: Secondary | ICD-10-CM | POA: Insufficient documentation

## 2015-08-08 DIAGNOSIS — J45909 Unspecified asthma, uncomplicated: Secondary | ICD-10-CM | POA: Insufficient documentation

## 2015-08-08 DIAGNOSIS — M549 Dorsalgia, unspecified: Secondary | ICD-10-CM | POA: Insufficient documentation

## 2015-08-08 DIAGNOSIS — F419 Anxiety disorder, unspecified: Secondary | ICD-10-CM | POA: Diagnosis not present

## 2015-08-08 DIAGNOSIS — Z9889 Other specified postprocedural states: Secondary | ICD-10-CM | POA: Diagnosis not present

## 2015-08-08 DIAGNOSIS — R1013 Epigastric pain: Secondary | ICD-10-CM | POA: Diagnosis not present

## 2015-08-08 DIAGNOSIS — Z8711 Personal history of peptic ulcer disease: Secondary | ICD-10-CM | POA: Insufficient documentation

## 2015-08-08 DIAGNOSIS — G8929 Other chronic pain: Secondary | ICD-10-CM | POA: Insufficient documentation

## 2015-08-08 DIAGNOSIS — R109 Unspecified abdominal pain: Secondary | ICD-10-CM | POA: Diagnosis present

## 2015-08-08 DIAGNOSIS — Z9049 Acquired absence of other specified parts of digestive tract: Secondary | ICD-10-CM | POA: Diagnosis not present

## 2015-08-08 DIAGNOSIS — F17210 Nicotine dependence, cigarettes, uncomplicated: Secondary | ICD-10-CM | POA: Diagnosis not present

## 2015-08-08 DIAGNOSIS — K219 Gastro-esophageal reflux disease without esophagitis: Secondary | ICD-10-CM | POA: Insufficient documentation

## 2015-08-08 DIAGNOSIS — Z79899 Other long term (current) drug therapy: Secondary | ICD-10-CM | POA: Insufficient documentation

## 2015-08-08 DIAGNOSIS — I252 Old myocardial infarction: Secondary | ICD-10-CM | POA: Insufficient documentation

## 2015-08-08 LAB — URINE MICROSCOPIC-ADD ON

## 2015-08-08 LAB — COMPREHENSIVE METABOLIC PANEL
ALBUMIN: 3.9 g/dL (ref 3.5–5.0)
ALK PHOS: 82 U/L (ref 38–126)
ALT: 40 U/L (ref 17–63)
AST: 55 U/L — ABNORMAL HIGH (ref 15–41)
Anion gap: 8 (ref 5–15)
BUN: 13 mg/dL (ref 6–20)
CALCIUM: 8.8 mg/dL — AB (ref 8.9–10.3)
CO2: 25 mmol/L (ref 22–32)
CREATININE: 0.91 mg/dL (ref 0.61–1.24)
Chloride: 105 mmol/L (ref 101–111)
GFR calc non Af Amer: >60 mL/min (ref >60)
GLUCOSE: 122 mg/dL — AB (ref 65–99)
Potassium: 4 mmol/L (ref 3.5–5.1)
Sodium: 138 mmol/L (ref 135–145)
Total Bilirubin: 0.5 mg/dL (ref 0.3–1.2)
Total Protein: 7.2 g/dL (ref 6.5–8.1)

## 2015-08-08 LAB — URINALYSIS, ROUTINE W REFLEX MICROSCOPIC
BILIRUBIN URINE: NEGATIVE
GLUCOSE, UA: NEGATIVE mg/dL
Ketones, ur: NEGATIVE mg/dL
Leukocytes, UA: NEGATIVE
Nitrite: NEGATIVE
Protein, ur: NEGATIVE mg/dL
pH: 5 (ref 5.0–8.0)

## 2015-08-08 LAB — CBC WITH DIFFERENTIAL/PLATELET
Basophils Absolute: 0 10*3/uL (ref 0.0–0.1)
Basophils Relative: 0 %
EOS ABS: 0.2 10*3/uL (ref 0.0–0.7)
EOS PCT: 1 %
HCT: 43.5 % (ref 39.0–52.0)
HEMOGLOBIN: 14.7 g/dL (ref 13.0–17.0)
LYMPHS ABS: 2.2 10*3/uL (ref 0.7–4.0)
LYMPHS PCT: 15 %
MCH: 32.3 pg (ref 26.0–34.0)
MCHC: 33.8 g/dL (ref 30.0–36.0)
MCV: 95.6 fL (ref 78.0–100.0)
MONOS PCT: 5 %
Monocytes Absolute: 0.8 10*3/uL (ref 0.1–1.0)
Neutro Abs: 12 10*3/uL — ABNORMAL HIGH (ref 1.7–7.7)
Neutrophils Relative %: 79 %
PLATELETS: 291 10*3/uL (ref 150–400)
RBC: 4.55 MIL/uL (ref 4.22–5.81)
RDW: 12.8 % (ref 11.5–15.5)
WBC: 15.2 10*3/uL — ABNORMAL HIGH (ref 4.0–10.5)

## 2015-08-08 LAB — LIPASE, BLOOD: Lipase: 27 U/L (ref 11–51)

## 2015-08-08 LAB — LACTIC ACID, PLASMA: Lactic Acid, Venous: 1.5 mmol/L (ref 0.5–2.0)

## 2015-08-08 MED ORDER — DIATRIZOATE MEGLUMINE & SODIUM 66-10 % PO SOLN
ORAL | Status: AC
  Filled 2015-08-08: qty 30

## 2015-08-08 MED ORDER — ONDANSETRON HCL 4 MG/2ML IJ SOLN
4.0000 mg | Freq: Once | INTRAMUSCULAR | Status: AC
  Administered 2015-08-08: 4 mg via INTRAVENOUS
  Filled 2015-08-08: qty 2

## 2015-08-08 MED ORDER — IOHEXOL 300 MG/ML  SOLN
100.0000 mL | Freq: Once | INTRAMUSCULAR | Status: AC | PRN
  Administered 2015-08-08: 100 mL via INTRAVENOUS

## 2015-08-08 MED ORDER — RANITIDINE HCL 150 MG PO TABS
150.0000 mg | ORAL_TABLET | Freq: Two times a day (BID) | ORAL | Status: DC
Start: 2015-08-08 — End: 2019-12-12

## 2015-08-08 MED ORDER — GI COCKTAIL ~~LOC~~
30.0000 mL | Freq: Once | ORAL | Status: AC
  Administered 2015-08-08: 30 mL via ORAL
  Filled 2015-08-08: qty 30

## 2015-08-08 MED ORDER — SODIUM CHLORIDE 0.9 % IV SOLN
1000.0000 mL | Freq: Once | INTRAVENOUS | Status: AC
  Administered 2015-08-08: 1000 mL via INTRAVENOUS

## 2015-08-08 MED ORDER — HYDROMORPHONE HCL 1 MG/ML IJ SOLN
1.0000 mg | Freq: Once | INTRAMUSCULAR | Status: AC
  Administered 2015-08-08: 1 mg via INTRAVENOUS
  Filled 2015-08-08: qty 1

## 2015-08-08 MED ORDER — OMEPRAZOLE 20 MG PO CPDR: 20.0000 mg | DELAYED_RELEASE_CAPSULE | Freq: Two times a day (BID) | ORAL | Status: DC

## 2015-08-08 NOTE — ED Notes (Signed)
Pt has abdominal pain that moves to his back. Pt has tenderness when touching stomach in any location.

## 2015-08-08 NOTE — ED Notes (Signed)
Pt unable to give urine specimen. Pt given fluids and food per MD request.

## 2015-08-08 NOTE — ED Provider Notes (Signed)
CSN: [id number]     Arrival date & time 08/08/15  1124 History  By signing my name below, I, Emmanuella Mensah, attest that this documentation has been prepared under the direction and in the presence of Merrily Pew, MD. Electronically Signed: Judithann Sauger, ED Scribe. 08/08/2015. 12:00 PM.    Chief Complaint  Patient presents with  . Abdominal Pain   The history is provided by the patient. No language interpreter was used.   HPI Comments: Vincent Hudson is a 60 y.o. male who presents to the Emergency Department complaining of gradually worsening constant right mid abdominal pain that radiates towards his back onset this am. No alleviating factors noted. He states that he has had 3 abdominal surgeries for ulcers and these symptoms are consistent with when he had the ulcers. He denies any NSAIDs use. He states that he drinks approx. several beers daily denying that he has tremors if he does not drink. He denies any diarrhea, blood in stool, or rash.     Past Medical History  Diagnosis Date  . History of stomach ulcers   . Arthritis   . Chronic back pain   . PUD (peptic ulcer disease)     bleeding; per patient multiple times in his past.  . Myocardial infarction ([place]) 2006  . GERD (gastroesophageal reflux disease)   . Asthma   . Degenerative disc disease, cervical     and lumbar  . Anxiety    Past Surgical History  Procedure Laterality Date  . Cholecystectomy    . Abdominal surgery      "for ulcers"  . Leg surgery Left     "drew fluid out of my leg for infectious arthritis".  . Colonoscopy with propofol N/A 11/21/2014    [id number] colon polyps removed/left colon is redundant/large internal hemorrhoids  . Esophagogastroduodenoscopy (egd) with propofol N/A 11/21/2014    [id number] non erosive gastritis/dyspepsiadue to IBS  . Polypectomy N/A 11/21/2014    Procedure: POLYPECTOMY;  Surgeon: Danie Binder, MD;  Location: [facility];  Service: Endoscopy;  Laterality: N/A;  . Biopsy N/A  11/21/2014    Procedure: BIOPSY;  Surgeon: Danie Binder, MD;  Location: [facility];  Service: Endoscopy;  Laterality: N/A;   Family History  Problem Relation Age of Onset  . Colon cancer Maternal Uncle   . Colon cancer Paternal Uncle    Social History  Substance Use Topics  . Smoking status: Current Every Day Smoker -- 0.75 packs/day for 40 years    Types: Cigarettes  . Smokeless tobacco: None  . Alcohol Use: 7.2 oz/week    12 Cans of beer, 0 Standard drinks or equivalent per week     Comment: ususally 2 beers a day.    Review of Systems  Gastrointestinal: Negative for blood in stool.  Musculoskeletal: Positive for back pain.  Skin: Negative for rash.  All other systems reviewed and are negative.     Allergies  Nexium; Penicillins; Aspirin; and Nsaids  Home Medications   Prior to Admission medications   Medication Sig Start Date End Date Taking? Authorizing Provider  albuterol (PROVENTIL) (2.5 MG/3ML) 0.083% nebulizer solution Take 2.5 mg by nebulization every 6 (six) hours as needed for wheezing or shortness of breath.   Yes Historical Provider, MD  ALPRAZolam Duanne Moron) 0.5 MG tablet Take 0.5 mg by mouth 3 (three) times daily as needed. For anxiety and nervous stomach   Yes Historical Provider, MD  dicyclomine (BENTYL) 10 MG capsule 1 PO 30 MINUTES PRIOR  TO BREAKFAST AND LUNCH to treat diarrhea and abdominal pain. Patient taking differently: Take 10 mg by mouth 4 (four) times daily -  before meals and at bedtime. 1 PO 30 MINUTES PRIOR TO BREAKFAST AND LUNCH to treat diarrhea and abdominal pain. 11/21/14  Yes Danie Binder, MD  oxyCODONE-acetaminophen (PERCOCET) 10-325 MG tablet Take 1 tablet by mouth every 4 (four) hours as needed for pain.   Yes Historical Provider, MD  albuterol (PROVENTIL HFA;VENTOLIN HFA) 108 (90 BASE) MCG/ACT inhaler Inhale 2 puffs into the lungs every 6 (six) hours as needed. For shortness of breath    Historical Provider, MD  omeprazole (PRILOSEC) 20 MG  capsule Take 1 capsule (20 mg total) by mouth 2 (two) times daily. Reported on 08/08/2015 08/08/15   Merrily Pew, MD  oxyCODONE-acetaminophen (PERCOCET/ROXICET) 5-325 MG per tablet Take 1 tablet by mouth every 4 (four) hours as needed for pain. Reported on 08/08/2015    Historical Provider, MD  ranitidine (ZANTAC) 150 MG tablet Take 1 tablet (150 mg total) by mouth 2 (two) times daily. 08/08/15   Merrily Pew, MD   BP 127/80 mmHg  Pulse 98  Temp(Src) 98.3 F (36.8 C) (Oral)  Resp 16  Ht 5\' 11"  (1.803 m)  Wt 205 lb (92.987 kg)  BMI 28.60 kg/m2  SpO2 93% Physical Exam  Constitutional: [name] oriented to person, place, and time. He appears well-developed and well-nourished.  HENT:  Head: Normocephalic and atraumatic.  Cardiovascular: Normal rate.   Pulmonary/Chest: Effort normal.  Abdominal: Soft. He exhibits no distension and no mass. There is tenderness. There is guarding. There is no rebound.  Tenderness diffusely, worse in the epigastric area  Neurological: [name] alert and oriented to person, place, and time.  Skin: Skin is warm and dry.  Psychiatric: He has a normal mood and affect.  Nursing note and vitals reviewed.   ED Course  Procedures (including critical care time) DIAGNOSTIC STUDIES: Oxygen Saturation is 96% on RA, normal by my interpretation.    COORDINATION OF CARE: 11:50 AM- Pt advised of plan for treatment and pt agrees. Pt will receive lab work and CT scan for further evaluation. He will also receive pain medication and IV fluids.    Labs Review Labs Reviewed  COMPREHENSIVE METABOLIC PANEL - Abnormal; Notable for the following:    Glucose, Bld 122 (*)    Calcium 8.8 (*)    AST 55 (*)    All other components within normal limits  CBC WITH DIFFERENTIAL/PLATELET - Abnormal; Notable for the following:    WBC 15.2 (*)    Neutro Abs 12.0 (*)    All other components within normal limits  URINALYSIS, ROUTINE W REFLEX MICROSCOPIC (NOT AT [facility]) - Abnormal; Notable for the  following:    Specific Gravity, Urine <1.005 (*)    Hgb urine dipstick TRACE (*)    All other components within normal limits  URINE MICROSCOPIC-ADD ON - Abnormal; Notable for the following:    Squamous Epithelial / LPF 0-5 (*)    Bacteria, UA RARE (*)    All other components within normal limits  LACTIC ACID, PLASMA  LIPASE, BLOOD    Imaging Review Ct Abdomen Pelvis W Contrast  08/08/2015  CLINICAL DATA:  Upper abdominal pain since 4 o'clock this morning. EXAM: CT ABDOMEN AND PELVIS WITH CONTRAST TECHNIQUE: Multidetector CT imaging of the abdomen and pelvis was performed using the standard protocol following bolus administration of intravenous contrast. CONTRAST:  169mL OMNIPAQUE IOHEXOL 300 MG/ML  SOLN COMPARISON:  02/13/2012 FINDINGS: Lower chest: Mild left basilar scarring. Normal heart size. Coronary artery atherosclerosis in the RCA. Hepatobiliary: Normal liver.  Pneumobilia.  Prior cholecystectomy. Pancreas: Normal. Spleen: Normal. Adrenals/Urinary Tract: 2 cm hypodense left adrenal nodule measuring 9 Hounsfield units likely representing an adrenal adenoma unchanged compared with 02/13/2012. Normal right adrenal gland. Normal kidneys. No urolithiasis or obstructive uropathy. Relative bladder wall thickening concerning for cystitis versus mild underdistention. Stomach/Bowel: No bowel wall thickening or dilatation. Postsurgical changes involving the stomach for gastric bypass. No extraluminal contrast to suggest perforation. No pneumatosis, pneumoperitoneum or portal venous gas. No abdominal or pelvic free fluid. 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 Vascular/Lymphatic: Normal caliber abdominal aorta. No lymphadenopathy. Other: No other fluid collection or hematoma. Musculoskeletal: No acute osseous abnormality. No lytic or sclerotic osseous lesion. IMPRESSION: 1. No acute abdominal or pelvic pathology. 2. Pneumobilia.  Electronically Signed   By: Kathreen Devoid   On: 08/08/2015 13:14     Merrily Pew, MD has personally reviewed and evaluated these images and lab results as part of his medical decision-making.   EKG Interpretation   Date/Time:  Wednesday August 08 2015 11:38:04 EST Ventricular Rate:  68 PR Interval:  144 QRS Duration: 92 QT Interval:  412 QTC Calculation: 438 R Axis:   66 Text Interpretation:  Sinus rhythm Confirmed by Seanmichael Salmons MD, Corene Cornea [id number])  on 08/08/2015 12:13:59 PM      MDM   Final diagnoses:  Epigastric pain    60 yo M w/ h/o bleeding ulcers, multiple abdominal surgeries for resections presents to the emergency department for abdominal pain. Patient states that the pain started this morning and usually goes away with his reflux medication but did not go away this morning but usually did. Some nausea no vomiting. Last bowel movement this morning was normal. Examination patient had distention initially had some guarding in the epigastric area's concern was for significant intra-abdominal pathology however CT scan was negative. Did show the pad of biliary but no other abnormalities. Patient's pain resolved with a couple doses of narcotics and a GI cocktail. I suspect he likely has gastritis secondary to his alcohol intake and his history of ulcers. He had a slightly elevated white blood cell count but hislactic acid was normal and the rest of his labs were also normal and with the absolute cessation of his pain feel less concerned about intra-abdominal surgical pathology..  He has a reliable way to get back here via EMS he doesn't have far away. He will also follow-up with his gastroenterologist and primary doctor for further evaluation of this pain. I have also added on some Zantac to his gastritis regimen.   I personally performed the services described in this documentation, which was scribed in my presence. The recorded information has been reviewed and is accurate.     Merrily Pew, MD 08/09/15 [phone]

## 2015-08-08 NOTE — ED Notes (Signed)
Patient complaining of upper middle abdominal pain starting at 0400 this morning. States "it hurts so bad I can't touch it." States he took prevacid with no relief. States he has history of bleeding ulcers.

## 2015-08-08 NOTE — ED Notes (Signed)
Pt unable to give urine specimen at this time 

## 2015-09-17 ENCOUNTER — Other Ambulatory Visit (HOSPITAL_COMMUNITY): Payer: Self-pay | Admitting: Family Medicine

## 2015-09-17 ENCOUNTER — Ambulatory Visit (HOSPITAL_COMMUNITY)
Admission: RE | Admit: 2015-09-17 | Discharge: 2015-09-17 | Disposition: A | Discharge status: Home / Self Care | Payer: Medicare Other | Source: Ambulatory Visit | Attending: Family Medicine | Admitting: Family Medicine

## 2015-09-17 DIAGNOSIS — M47816 Spondylosis without myelopathy or radiculopathy, lumbar region: Secondary | ICD-10-CM | POA: Diagnosis not present

## 2015-09-17 DIAGNOSIS — M5441 Lumbago with sciatica, right side: Secondary | ICD-10-CM | POA: Diagnosis not present

## 2016-09-08 ENCOUNTER — Ambulatory Visit: Payer: Self-pay | Admitting: Orthopedic Surgery

## 2016-09-09 ENCOUNTER — Encounter: Payer: Self-pay | Admitting: Orthopedic Surgery

## 2016-11-17 ENCOUNTER — Other Ambulatory Visit (HOSPITAL_COMMUNITY): Payer: Self-pay | Admitting: Family Medicine

## 2016-11-17 ENCOUNTER — Ambulatory Visit (HOSPITAL_COMMUNITY)
Admission: RE | Admit: 2016-11-17 | Discharge: 2016-11-17 | Disposition: A | Discharge status: Home / Self Care | Payer: Medicare Other | Source: Ambulatory Visit | Attending: Family Medicine | Admitting: Family Medicine

## 2016-11-17 DIAGNOSIS — G8929 Other chronic pain: Secondary | ICD-10-CM

## 2017-01-14 ENCOUNTER — Other Ambulatory Visit (HOSPITAL_COMMUNITY): Payer: Self-pay | Admitting: Neurosurgery

## 2017-01-14 DIAGNOSIS — M542 Cervicalgia: Principal | ICD-10-CM

## 2017-01-14 DIAGNOSIS — G8929 Other chronic pain: Secondary | ICD-10-CM

## 2017-01-20 ENCOUNTER — Ambulatory Visit (HOSPITAL_COMMUNITY)
Admission: RE | Admit: 2017-01-20 | Discharge: 2017-01-20 | Disposition: A | Discharge status: Home / Self Care | Payer: Medicare Other | Source: Ambulatory Visit | Attending: Neurosurgery | Admitting: Neurosurgery

## 2017-01-20 DIAGNOSIS — M48061 Spinal stenosis, lumbar region without neurogenic claudication: Secondary | ICD-10-CM | POA: Insufficient documentation

## 2017-01-20 DIAGNOSIS — G8929 Other chronic pain: Secondary | ICD-10-CM | POA: Insufficient documentation

## 2017-01-20 DIAGNOSIS — R609 Edema, unspecified: Secondary | ICD-10-CM | POA: Diagnosis not present

## 2017-01-20 DIAGNOSIS — M47812 Spondylosis without myelopathy or radiculopathy, cervical region: Secondary | ICD-10-CM | POA: Diagnosis not present

## 2017-01-20 DIAGNOSIS — M4802 Spinal stenosis, cervical region: Secondary | ICD-10-CM | POA: Diagnosis not present

## 2017-01-20 DIAGNOSIS — M542 Cervicalgia: Secondary | ICD-10-CM

## 2017-01-20 DIAGNOSIS — M47816 Spondylosis without myelopathy or radiculopathy, lumbar region: Secondary | ICD-10-CM | POA: Insufficient documentation

## 2017-10-22 ENCOUNTER — Encounter: Payer: Self-pay | Admitting: Gastroenterology

## 2019-10-20 ENCOUNTER — Other Ambulatory Visit: Payer: Self-pay

## 2019-10-20 ENCOUNTER — Other Ambulatory Visit (HOSPITAL_COMMUNITY): Payer: Self-pay | Admitting: Family Medicine

## 2019-10-20 ENCOUNTER — Ambulatory Visit (HOSPITAL_COMMUNITY)
Admission: RE | Admit: 2019-10-20 | Discharge: 2019-10-20 | Disposition: A | Discharge status: Home / Self Care | Payer: Medicare Other | Source: Ambulatory Visit | Attending: Family Medicine | Admitting: Family Medicine

## 2019-10-20 DIAGNOSIS — M7011 Bursitis, right hand: Secondary | ICD-10-CM | POA: Insufficient documentation

## 2019-10-20 DIAGNOSIS — M7551 Bursitis of right shoulder: Secondary | ICD-10-CM

## 2019-12-12 ENCOUNTER — Encounter: Payer: Self-pay | Admitting: Orthopedic Surgery

## 2019-12-12 ENCOUNTER — Telehealth: Payer: Self-pay | Admitting: Radiology

## 2019-12-12 ENCOUNTER — Ambulatory Visit (INDEPENDENT_AMBULATORY_CARE_PROVIDER_SITE_OTHER): Payer: Medicare Other | Admitting: Orthopedic Surgery

## 2019-12-12 ENCOUNTER — Other Ambulatory Visit: Payer: Self-pay

## 2019-12-12 VITALS — BP 159/75 | HR 77 | Ht 71.0 in | Wt 197.0 lb

## 2019-12-12 DIAGNOSIS — M71331 Other bursal cyst, right wrist: Secondary | ICD-10-CM

## 2019-12-12 DIAGNOSIS — S4991XA Unspecified injury of right shoulder and upper arm, initial encounter: Secondary | ICD-10-CM

## 2019-12-12 DIAGNOSIS — M7541 Impingement syndrome of right shoulder: Secondary | ICD-10-CM | POA: Diagnosis not present

## 2019-12-12 DIAGNOSIS — M65331 Trigger finger, right middle finger: Secondary | ICD-10-CM

## 2019-12-12 NOTE — Progress Notes (Signed)
NEW PROBLEM//OFFICE VISIT  Chief Complaint  Patient presents with  . Shoulder Pain    right x 6 months since a fall   . Wrist Problem    cyst on right wrist     64 year old male presents with cyst on his right hand is really over the ulnar aspect of the wrist he also complains of catching and locking of the long finger/middle finger of the right hand and then he also has a history of a prior injury to his right shoulder falling and landing directly over the [facility] joint he has swelling there and has lost motion in the shoulder but denies any prior shoulder problems   Review of Systems  Eyes: Positive for blurred vision.  Respiratory: Positive for shortness of breath and wheezing.   Gastrointestinal: Positive for abdominal pain and heartburn.  Genitourinary: Positive for dysuria.  Musculoskeletal: Positive for back pain, joint pain, myalgias and neck pain.  Neurological: Positive for weakness.  Psychiatric/Behavioral: Positive for memory loss.  All other systems reviewed and are negative.    Past Medical History:  Diagnosis Date  . Anxiety   . Arthritis   . Asthma   . Chronic back pain   . Degenerative disc disease, cervical    and lumbar  . GERD (gastroesophageal reflux disease)   . History of stomach ulcers   . Myocardial infarction ([place]) 2006  . PUD (peptic ulcer disease)    bleeding; per patient multiple times in his past.    Past Surgical History:  Procedure Laterality Date  . ABDOMINAL SURGERY     "for ulcers"  . BIOPSY N/A 11/21/2014   Procedure: BIOPSY;  Surgeon: Danie Binder, MD;  Location: [facility];  Service: Endoscopy;  Laterality: N/A;  . CHOLECYSTECTOMY    . COLONOSCOPY WITH PROPOFOL N/A 11/21/2014   XVQ:[id number] colon polyps removed/left colon is redundant/large internal hemorrhoids  . ESOPHAGOGASTRODUODENOSCOPY (EGD) WITH PROPOFOL N/A 11/21/2014   PYP:PJKD non erosive gastritis/dyspepsiadue to IBS  . LEG SURGERY Left    "drew fluid out of my leg for infectious  arthritis".  . POLYPECTOMY N/A 11/21/2014   Procedure: POLYPECTOMY;  Surgeon: Danie Binder, MD;  Location: [facility];  Service: Endoscopy;  Laterality: N/A;    Family History  Problem Relation Age of Onset  . Colon cancer Maternal Uncle   . Colon cancer Paternal Uncle    Social History   Tobacco Use  . Smoking status: Current Every Day Smoker    Packs/day: 0.75    Years: 40.00    Pack years: 30.00    Types: Cigarettes  . Smokeless tobacco: Never Used  Substance Use Topics  . Alcohol use: Yes    Alcohol/week: 12.0 standard drinks    Types: 12 Cans of beer per week    Comment: ususally 2 beers a day.  . Drug use: No    Allergies  Allergen Reactions  . Nexium [Esomeprazole] Swelling    Throat swelling  . Penicillins Anaphylaxis and Rash    Has patient had a PCN reaction causing immediate rash, facial/tongue/throat swelling, SOB or lightheadedness with hypotension: Yes Has patient had a PCN reaction causing severe rash involving mucus membranes or skin necrosis: Yes Has patient had a PCN reaction that required hospitalization Yes Has patient had a PCN reaction occurring within the last 10 years: No If all of the above answers are "NO", then may proceed with Cephalosporin use.   . Aspirin Other (See Comments)    Stomach bleeding  . Nsaids  Other (See Comments)    Stomach bleeding    Current Meds  Medication Sig  . albuterol (PROVENTIL HFA;VENTOLIN HFA) 108 (90 BASE) MCG/ACT inhaler Inhale 2 puffs into the lungs every 6 (six) hours as needed. For shortness of breath  . albuterol (PROVENTIL) (2.5 MG/3ML) 0.083% nebulizer solution Take 2.5 mg by nebulization every 6 (six) hours as needed for wheezing or shortness of breath.  . ALPRAZolam (XANAX) 0.5 MG tablet Take 0.5 mg by mouth 3 (three) times daily as needed. For anxiety and nervous stomach  . gabapentin (NEURONTIN) 300 MG capsule Take 300 mg by mouth 3 (three) times daily.  . naproxen (NAPROSYN) 500 MG tablet Take 500 mg by  mouth 2 (two) times daily.  Marland Kitchen oxyCODONE-acetaminophen (PERCOCET) 10-325 MG tablet Take 1 tablet by mouth every 4 (four) hours as needed for pain.  . pantoprazole (PROTONIX) 40 MG tablet Take 40 mg by mouth daily.  . TRELEGY ELLIPTA 100-62.5-25 MCG/INH AEPB Take 1 puff by mouth daily.    BP (!) 159/75   Pulse 77   Ht 5\' 11"  (1.803 m)   Wt 197 lb (89.4 kg)   BMI 27.48 kg/m   Physical Exam Constitutional:      General: [name] not in acute distress.    Appearance: [name] well-developed.  Cardiovascular:     Comments: No peripheral edema Skin:    General: Skin is warm and dry.  Neurological:     Mental Status: [name] alert and oriented to person, place, and time.     Sensory: No sensory deficit.     Coordination: Coordination normal.     Gait: Gait normal.     Deep Tendon Reflexes: Reflexes are normal and symmetric.     Ortho Exam  I will start with his right hand he has a small 1 x 1 cm globular firm mass in the subcutaneous tissue at the ulnocarpal joint nontender seems to move well no associated lymphadenopathy no erythema around the skin  Then he has tenderness over the A1 pulley of the right long finger with catching and locking requiring manipulation for release  Right shoulder swollen tender over the [facility] joint for active motion normal passive motion positive impingement sign  Encounter Diagnoses  Name Primary?  . Trigger finger, right middle finger Yes  . Injury of right acromioclavicular joint, initial encounter   . Impingement syndrome of right shoulder   . Other bursal cyst of right wrist       MEDICAL DECISION MAKING  A.  Encounter Diagnoses  Name Primary?  . Trigger finger, right middle finger Yes  . Injury of right acromioclavicular joint, initial encounter   . Impingement syndrome of right shoulder   . Other bursal cyst of right wrist     B. DATA ANALYSED:  Yes right hand film   IMAGING: Independent interpretation of images: No acute fracture  dislocation or bony abnormality seen in the right hand  Orders:   Outside records reviewed: no  C. MANAGEMENT   1.Subacromial injection right shoulder  Procedure note the subacromial injection shoulder RIGHT    Verbal consent was obtained to inject the  RIGHT   Shoulder  Timeout was completed to confirm the injection site is a subacromial space of the  RIGHT  shoulder   Medication used Depo-Medrol 40 mg and lidocaine 1% 3 cc  Anesthesia was provided by ethyl chloride  The injection was performed in the RIGHT  posterior subacromial space. After pinning the skin with  alcohol and anesthetized the skin with ethyl chloride the subacromial space was injected using a 20-gauge needle. There were no complications  Sterile dressing was applied.  Codman exercises right shoulder   2. Trigger finger injection right hand  Trigger finger injection  Diagnosis right hand trigger finger, long finger Procedure injection A1 pulley Medications lidocaine 1% 1 mL and Depo-Medrol 40 mg 1 mL Skin prep alcohol and ethyl chloride Verbal consent was obtained Timeout confirmed the injection site  After cleaning the skin with alcohol and anesthetizing the skin with ethyl chloride the A1 pulley was palpated and the injection was performed without complication   3. Schedule surgery right wrist excisional biopsy mass/cyst right wrist  The procedure has been fully reviewed with the patient; The risks and benefits of surgery have been discussed and explained and understood. Alternative treatment has also been reviewed, questions were encouraged and answered. The postoperative plan is also been reviewed.  No orders of the defined types were placed in this encounter.     Arther Abbott, MD  12/12/2019 9:34 AM

## 2019-12-12 NOTE — Telephone Encounter (Signed)
Prior auth of the upcoming wrist surgery [address] CPT Per [facility] Notification or Prior Authorization is not required for the requested services  Decision ID #:[id number]

## 2019-12-15 ENCOUNTER — Encounter (HOSPITAL_COMMUNITY): Payer: Self-pay

## 2019-12-15 NOTE — Patient Instructions (Signed)
Your procedure is scheduled on: 12/22/2019  Report to [facility] at  8:15   AM.  Call this number if you have problems the morning of surgery: [phone]   Remember:   Do not Eat or Drink after midnight         No Smoking the morning of surgery  :  Take these medicines the morning of surgery with A SIP OF WATER: protonix, xanax and gabapentin   Use inhalers if needed   Do not wear jewelry, make-up or nail polish.  Do not wear lotions, powders, or perfumes. You may wear deodorant.  Do not shave 48 hours prior to surgery. Men may shave face and neck.  Do not bring valuables to the hospital.  Contacts, dentures or bridgework may not be worn into surgery.  Leave suitcase in the car. After surgery it may be brought to your room.  For patients admitted to the hospital, checkout time is 11:00 AM the day of discharge.   Patients discharged the day of surgery will not be allowed to drive home.    Special Instructions: Shower using CHG night before surgery and shower the day of surgery use CHG.  Use special wash - you have one bottle of CHG for all showers.  You should use approximately 1/2 of the bottle for each shower.  Ganglion Cyst  A ganglion cyst is a non-cancerous, fluid-filled lump that occurs near a joint or tendon. The cyst grows out of a joint or the lining of a tendon. Ganglion cysts most often develop in the hand or wrist, but they can also develop in the shoulder, elbow, hip, knee, ankle, or foot. Ganglion cysts are ball-shaped or egg-shaped. Their size can range from the size of a pea to larger than a grape. Increased activity may cause the cyst to get bigger because more fluid starts to build up. What are the causes? The exact cause of this condition is not known, but it may be related to:  Inflammation or irritation around the joint.  An injury.  Repetitive movements or overuse.  Arthritis. What increases the risk? You are more likely to develop this condition  if:  You are a woman.  You are 98-33 years old. What are the signs or symptoms? The main symptom of this condition is a lump. It most often appears on the hand or wrist. In many cases, there are no other symptoms, but a cyst can sometimes cause:  Tingling.  Pain.  Numbness.  Muscle weakness.  Weak grip.  Less range of motion in a joint. How is this diagnosed? Ganglion cysts are usually diagnosed based on a physical exam. Your health care provider will feel the lump and may shine a light next to it. If it is a ganglion cyst, the light will likely shine through it. Your health care provider may order an X-ray, ultrasound, or MRI to rule out other conditions. How is this treated? Ganglion cysts often go away on their own without treatment. If you have pain or other symptoms, treatment may be needed. Treatment is also needed if the ganglion cyst limits your movement or if it gets infected. Treatment may include:  Wearing a brace or splint on your wrist or finger.  Taking anti-inflammatory medicine.  Having fluid drained from the lump with a needle (aspiration).  Getting a steroid injected into the joint.  Having surgery to remove the ganglion cyst.  Placing a pad on your shoe or wearing shoes that will not rub  against the cyst if it is on your foot. Follow these instructions at home:  Do not press on the ganglion cyst, poke it with a needle, or hit it.  Take over-the-counter and prescription medicines only as told by your health care provider.  If you have a brace or splint: ? Wear it as told by your health care provider. ? Remove it as told by your health care provider. Ask if you need to remove it when you take a shower or a bath.  Watch your ganglion cyst for any changes.  Keep all follow-up visits as told by your health care provider. This is important. Contact a health care provider if:  Your ganglion cyst becomes larger or more painful.  You have pus coming from  the lump.  You have weakness or numbness in the affected area.  You have a fever or chills. Get help right away if:  You have a fever and have any of these in the cyst area: ? Increased redness. ? Red streaks. ? Swelling. Summary  A ganglion cyst is a non-cancerous, fluid-filled lump that occurs near a joint or tendon.  Ganglion cysts most often develop in the hand or wrist, but they can also develop in the shoulder, elbow, hip, knee, ankle, or foot.  Ganglion cysts often go away on their own without treatment. This information is not intended to replace advice given to you by your health care provider. Make sure you discuss any questions you have with your health care provider. Document Revised: 05/29/2017 Document Reviewed: 02/13/2017 Elsevier Patient Education  [place] These instructions provide you with information about caring for yourself after your procedure. Your health care provider may also give you more specific instructions. Your treatment has been planned according to current medical practices, but problems sometimes occur. Call your health care provider if you have any problems or questions after your procedure. What can I expect after the procedure? After your procedure, you may:  Feel sleepy for several hours.  Feel clumsy and have poor balance for several hours.  Feel forgetful about what happened after the procedure.  Have poor judgment for several hours.  Feel nauseous or vomit.  Have a sore throat if you had a breathing tube during the procedure. Follow these instructions at home: For at least 24 hours after the procedure:      Have a responsible adult stay with you. It is important to have someone help care for you until you are awake and alert.  Rest as needed.  Do not: ? Participate in activities in which you could fall or become injured. ? Drive. ? Use heavy machinery. ? Drink  alcohol. ? Take sleeping pills or medicines that cause drowsiness. ? Make important decisions or sign legal documents. ? Take care of children on your own. Eating and drinking  Follow the diet that is recommended by your health care provider.  If you vomit, drink water, juice, or soup when you can drink without vomiting.  Make sure you have little or no nausea before eating solid foods. General instructions  Take over-the-counter and prescription medicines only as told by your health care provider.  If you have sleep apnea, surgery and certain medicines can increase your risk for breathing problems. Follow instructions from your health care provider about wearing your sleep device: ? Anytime you are sleeping, including during daytime naps. ? While taking prescription pain medicines, sleeping medicines, or medicines that make you  drowsy.  If you smoke, do not smoke without supervision.  Keep all follow-up visits as told by your health care provider. This is important. Contact a health care provider if:  You keep feeling nauseous or you keep vomiting.  You feel light-headed.  You develop a rash.  You have a fever. Get help right away if:  You have trouble breathing. Summary  For several hours after your procedure, you may feel sleepy and have poor judgment.  Have a responsible adult stay with you for at least 24 hours or until you are awake and alert. This information is not intended to replace advice given to you by your health care provider. Make sure you discuss any questions you have with your health care provider. Document Revised: 09/14/2017 Document Reviewed: 10/07/2015 Elsevier Patient Education  [place].

## 2019-12-20 ENCOUNTER — Other Ambulatory Visit (HOSPITAL_COMMUNITY): Payer: Medicare Other

## 2019-12-20 ENCOUNTER — Encounter (HOSPITAL_COMMUNITY)
Admission: RE | Admit: 2019-12-20 | Discharge: 2019-12-20 | Disposition: A | Discharge status: Home / Self Care | Payer: Medicare Other | Source: Ambulatory Visit | Attending: Orthopedic Surgery | Admitting: Orthopedic Surgery

## 2019-12-20 ENCOUNTER — Encounter (HOSPITAL_COMMUNITY): Payer: Self-pay

## 2019-12-20 NOTE — Pre-Procedure Instructions (Signed)
Message sent to Amy Littrell at Dr Harrison's office due to patient not showing for PAT.

## 2019-12-21 ENCOUNTER — Telehealth: Payer: Self-pay | Admitting: Radiology

## 2019-12-21 NOTE — Telephone Encounter (Signed)
Advised her to Cancel, if he no showed no need to RS. If he wants to RS he should let us know.

## 2019-12-21 NOTE — Telephone Encounter (Signed)
-----   Message from Josue Hector sent at 12/21/2019  1:53 PM EDT ----- Marykay Lex,  This patient did not come for PAT or covid test.  I am taking him off the schedule, I will hold it in the depot a few days.  Just let me know if we are rescheduling it or canceling it completely.  Thanks, Hoyle Sauer

## 2019-12-21 NOTE — H&P (Signed)
NEW PROBLEM//OFFICE VISIT   Chief Complaint  Patient presents with  .        . Wrist Problem      cyst on right wrist       64 year old male presents with cyst on his right hand is really over the ulnar aspect of the wrist he also complains of catching and locking of the long finger/middle finger of the right hand and then he also has a history of a prior injury to his right shoulder falling and landing directly over the [facility] joint he has swelling there and has lost motion in the shoulder but denies any prior shoulder problems     Review of Systems  Eyes: Positive for blurred vision.  Respiratory: Positive for shortness of breath and wheezing.   Gastrointestinal: Positive for abdominal pain and heartburn.  Genitourinary: Positive for dysuria.  Musculoskeletal: Positive for back pain, joint pain, myalgias and neck pain.  Neurological: Positive for weakness.  Psychiatric/Behavioral: Positive for memory loss.  All other systems reviewed and are negative.           Past Medical History:  Diagnosis Date  . Anxiety    . Arthritis    . Asthma    . Chronic back pain    . Degenerative disc disease, cervical      and lumbar  . GERD (gastroesophageal reflux disease)    . History of stomach ulcers    . Myocardial infarction ([place]) 2006  . PUD (peptic ulcer disease)      bleeding; per patient multiple times in his past.           Past Surgical History:  Procedure Laterality Date  . ABDOMINAL SURGERY        "for ulcers"  . BIOPSY N/A 11/21/2014    Procedure: BIOPSY;  Surgeon: Danie Binder, MD;  Location: [facility];  Service: Endoscopy;  Laterality: N/A;  . CHOLECYSTECTOMY      . COLONOSCOPY WITH PROPOFOL N/A 11/21/2014    WVP:XTGG colon polyps removed/left colon is redundant/large internal hemorrhoids  . ESOPHAGOGASTRODUODENOSCOPY (EGD) WITH PROPOFOL N/A 11/21/2014    YIR:SWNI non erosive gastritis/dyspepsiadue to IBS  . 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  . POLYPECTOMY N/A 11/21/2014    Procedure: POLYPECTOMY;  Surgeon: Danie Binder, MD;  Location: [facility];  Service: Endoscopy;  Laterality: N/A;           Family History  Problem Relation Age of Onset  . Colon cancer Maternal Uncle    . Colon cancer Paternal Uncle      Social History         Tobacco Use  . Smoking status: Current Every Day Smoker      Packs/day: 0.75      Years: 40.00      Pack years: 30.00      Types: Cigarettes  . Smokeless tobacco: Never Used  Substance Use Topics  . Alcohol use: Yes      Alcohol/week: 12.0 standard drinks      Types: 12 Cans of beer per week      Comment: ususally 2 beers a day.  . Drug use: No           Allergies  Allergen Reactions  . Nexium [Esomeprazole] Swelling      Throat swelling  . Penicillins Anaphylaxis and Rash      Has patient had a PCN reaction causing immediate rash,  facial/tongue/throat swelling, SOB or lightheadedness with hypotension: Yes Has patient had a PCN reaction causing severe rash involving mucus membranes or skin necrosis: Yes Has patient had a PCN reaction that required hospitalization Yes Has patient had a PCN reaction occurring within the last 10 years: No If all of the above answers are "NO", then may proceed with Cephalosporin use.    . Aspirin Other (See Comments)      Stomach bleeding  . Nsaids Other (See Comments)      Stomach bleeding      Active Medications      Current Meds  Medication Sig  . albuterol (PROVENTIL HFA;VENTOLIN HFA) 108 (90 BASE) MCG/ACT inhaler Inhale 2 puffs into the lungs every 6 (six) hours as needed. For shortness of breath  . albuterol (PROVENTIL) (2.5 MG/3ML) 0.083% nebulizer solution Take 2.5 mg by nebulization every 6 (six) hours as needed for wheezing or shortness of breath.  . ALPRAZolam (XANAX) 0.5 MG tablet Take 0.5 mg by mouth 3 (three) times daily as needed. For anxiety and nervous stomach  . gabapentin (NEURONTIN) 300 MG capsule Take  300 mg by mouth 3 (three) times daily.  . naproxen (NAPROSYN) 500 MG tablet Take 500 mg by mouth 2 (two) times daily.  Marland Kitchen oxyCODONE-acetaminophen (PERCOCET) 10-325 MG tablet Take 1 tablet by mouth every 4 (four) hours as needed for pain.  . pantoprazole (PROTONIX) 40 MG tablet Take 40 mg by mouth daily.  . TRELEGY ELLIPTA 100-62.5-25 MCG/INH AEPB Take 1 puff by mouth daily.        BP (!) 159/75   Pulse 77   Ht 5\' 11"  (1.803 m)   Wt 197 lb (89.4 kg)   BMI 27.48 kg/m    Physical Exam Constitutional:      General: [name] not in acute distress.    Appearance: [name] well-developed.  Cardiovascular:     Comments: No peripheral edema Skin:    General: Skin is warm and dry.  Neurological:     Mental Status: [name] alert and oriented to person, place, and time.     Sensory: No sensory deficit.     Coordination: Coordination normal.     Gait: Gait normal.     Deep Tendon Reflexes: Reflexes are normal and symmetric.       Ortho Exam   I will start with his right hand he has a small 1 x 1 cm globular firm mass in the subcutaneous tissue at the ulnocarpal joint nontender seems to move well no associated lymphadenopathy no erythema around the skin   Then he has tenderness over the A1 pulley of the right long finger with catching and locking requiring manipulation for release   Right shoulder swollen tender over the [facility] joint for active motion normal passive motion positive impingement sign       Encounter Diagnoses  Name Primary?  . Trigger finger, right middle finger Yes  . Injury of right acromioclavicular joint, initial encounter    . Impingement syndrome of right shoulder    . Other bursal cyst of right wrist            MEDICAL DECISION MAKING   A.      Encounter Diagnoses  Name Primary?  . Trigger finger, right middle finger Yes  . Injury of right acromioclavicular joint, initial encounter    . Impingement syndrome of right shoulder    . Other bursal cyst of right wrist  B. DATA ANALYSED:     IMAGING: Independent interpretation of images: No acute fracture dislocation or bony abnormality seen in the right hand  Orders:   Outside records reviewed: no  C. MANAGEMENT        Schedule surgery right wrist excisional biopsy mass/cyst right wrist   The procedure has been fully reviewed with the patient; The risks and benefits of surgery have been discussed and explained and understood. Alternative treatment has also been reviewed, questions were encouraged and answered. The postoperative plan is also been reviewed.   No orders of the defined types were placed in this encounter.

## 2019-12-22 ENCOUNTER — Ambulatory Visit (HOSPITAL_COMMUNITY): Admission: RE | Admit: 2019-12-22 | Payer: Medicare Other | Source: Home / Self Care | Admitting: Orthopedic Surgery

## 2019-12-22 ENCOUNTER — Encounter (HOSPITAL_COMMUNITY): Admission: RE | Payer: Self-pay | Source: Home / Self Care

## 2019-12-22 SURGERY — EXCISION, GANGLION CYST, WRIST
Anesthesia: Choice | Site: Wrist | Laterality: Right

## 2020-12-12 ENCOUNTER — Telehealth: Payer: Self-pay

## 2020-12-12 NOTE — Telephone Encounter (Signed)
New Patient-No per Posey Pronto

## 2021-03-07 ENCOUNTER — Encounter (HOSPITAL_COMMUNITY): Payer: Self-pay

## 2021-03-07 ENCOUNTER — Emergency Department (HOSPITAL_COMMUNITY)
Admission: EM | Admit: 2021-03-07 | Discharge: 2021-03-07 | Disposition: A | Discharge status: Home / Self Care | Payer: Medicare Other | Attending: Emergency Medicine | Admitting: Emergency Medicine

## 2021-03-07 ENCOUNTER — Other Ambulatory Visit: Payer: Self-pay

## 2021-03-07 DIAGNOSIS — Z5321 Procedure and treatment not carried out due to patient leaving prior to being seen by health care provider: Secondary | ICD-10-CM | POA: Insufficient documentation

## 2021-03-07 DIAGNOSIS — K0889 Other specified disorders of teeth and supporting structures: Secondary | ICD-10-CM | POA: Insufficient documentation

## 2021-03-07 NOTE — ED Triage Notes (Signed)
Pt here for jaw pain, in process of having his teeth pulled out and getting dentures.

## 2021-05-14 ENCOUNTER — Other Ambulatory Visit (HOSPITAL_COMMUNITY): Payer: Self-pay | Admitting: Family Medicine

## 2021-05-14 ENCOUNTER — Other Ambulatory Visit: Payer: Self-pay

## 2021-05-14 ENCOUNTER — Ambulatory Visit (HOSPITAL_COMMUNITY)
Admission: RE | Admit: 2021-05-14 | Discharge: 2021-05-14 | Disposition: A | Discharge status: Home / Self Care | Payer: Medicare Other | Source: Ambulatory Visit | Attending: Family Medicine | Admitting: Family Medicine

## 2021-05-14 DIAGNOSIS — M25511 Pain in right shoulder: Secondary | ICD-10-CM

## 2021-06-11 ENCOUNTER — Other Ambulatory Visit (HOSPITAL_COMMUNITY): Payer: Self-pay | Admitting: Family Medicine

## 2021-06-11 DIAGNOSIS — N6320 Unspecified lump in the left breast, unspecified quadrant: Secondary | ICD-10-CM

## 2021-10-21 IMAGING — DX DG HAND COMPLETE 3+V*R*
3 series · 3 of 3 positions shown · non-contrast
Comparison: None.

CLINICAL DATA: 64-year-old with acute onset of RIGHT wrist and
RIGHT hand pain, and 2 palpable knots involving the ulnar side of
the wrist and hand which he recently noticed.

EXAM:
RIGHT HAND - COMPLETE 3+ VIEW

[hand pa]
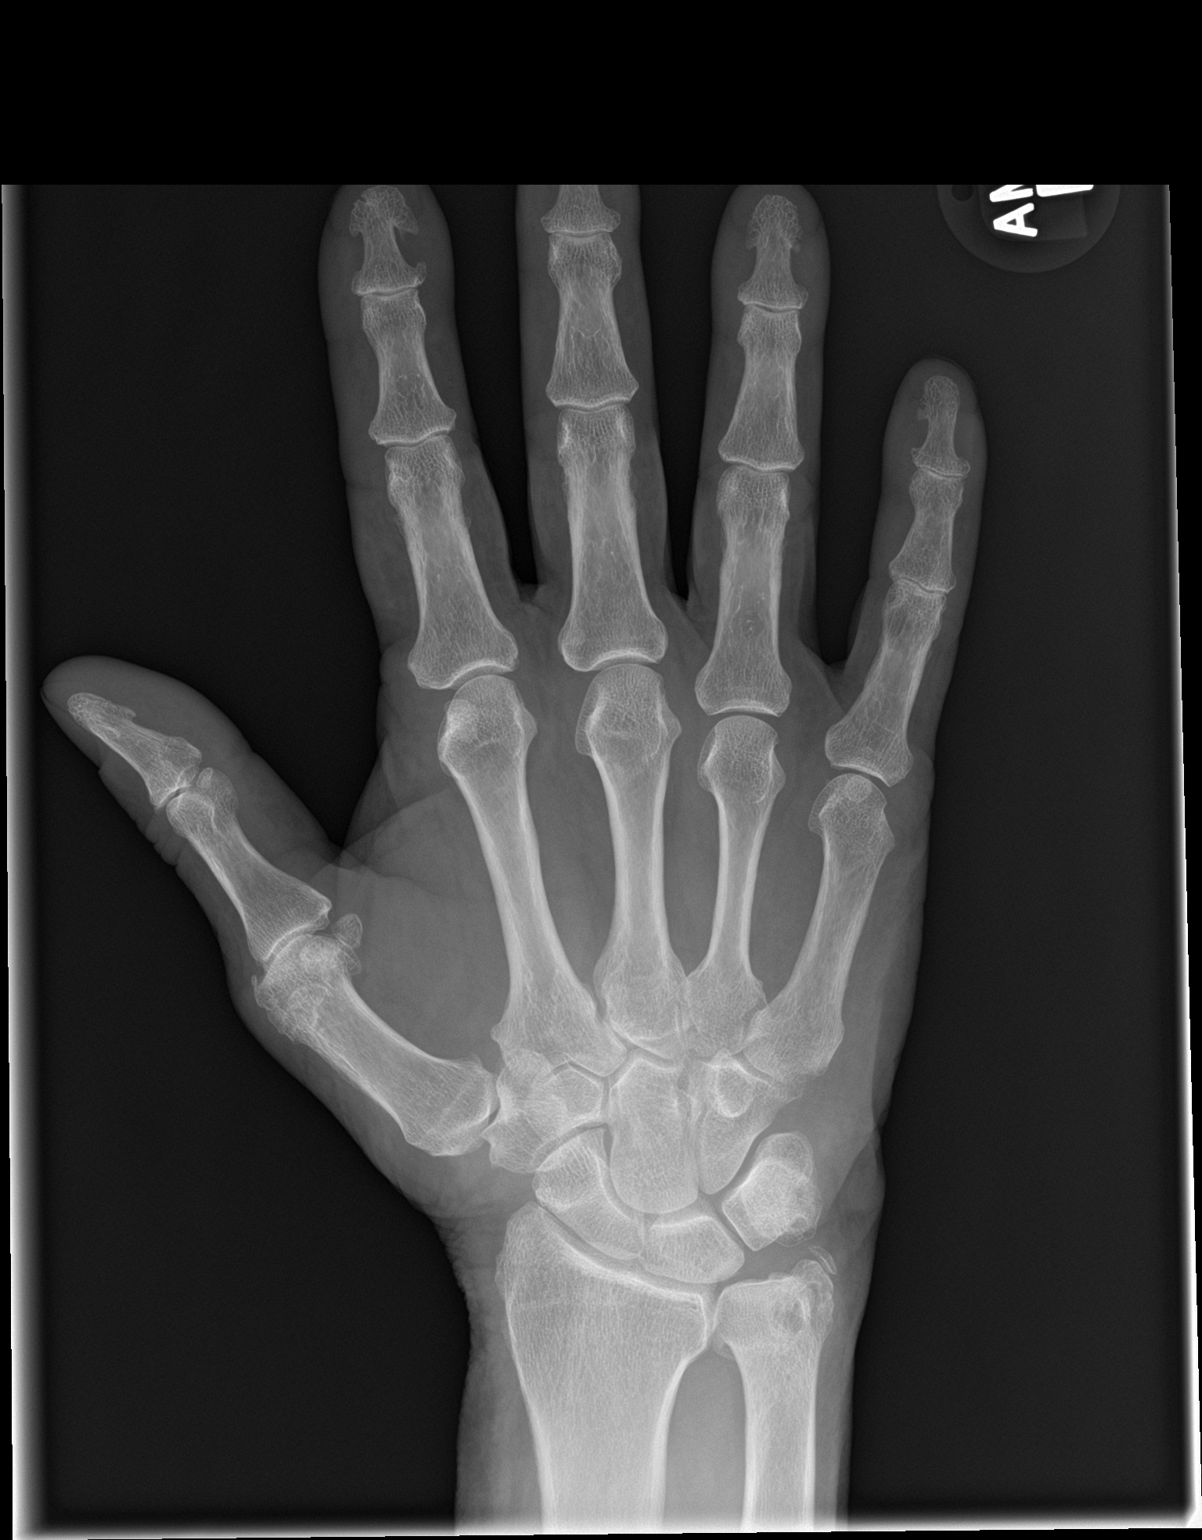

[hand obl]
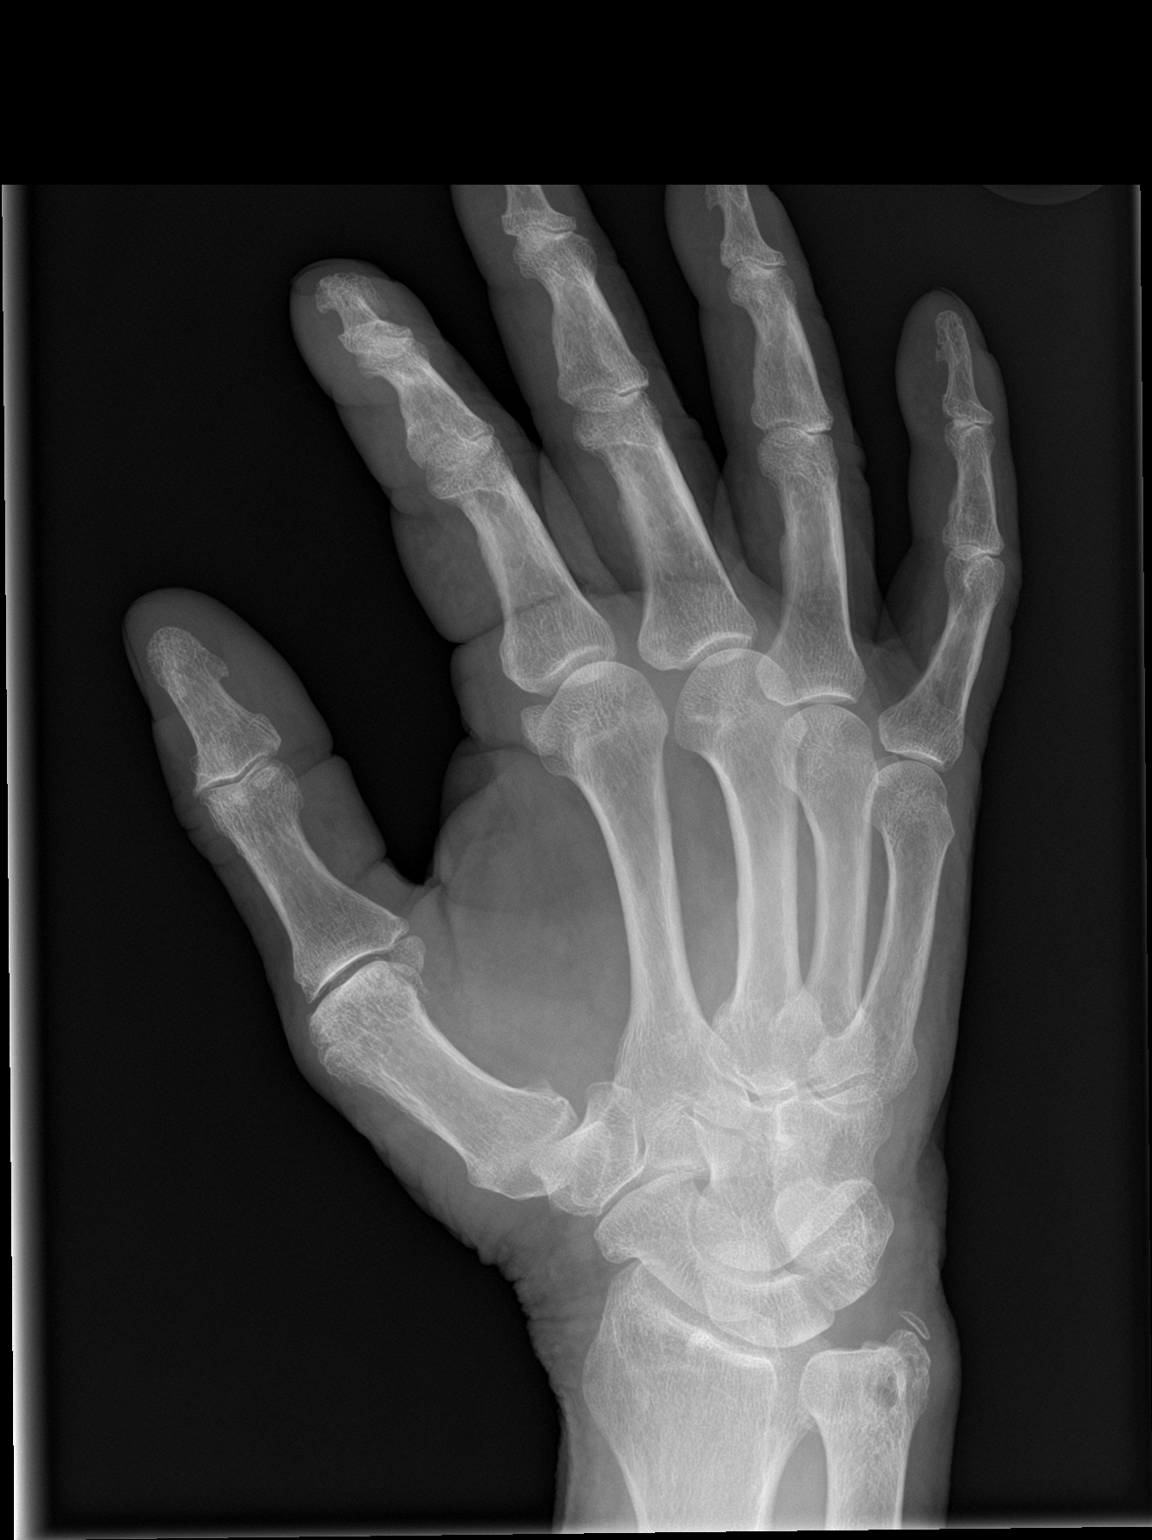

[hand lat]
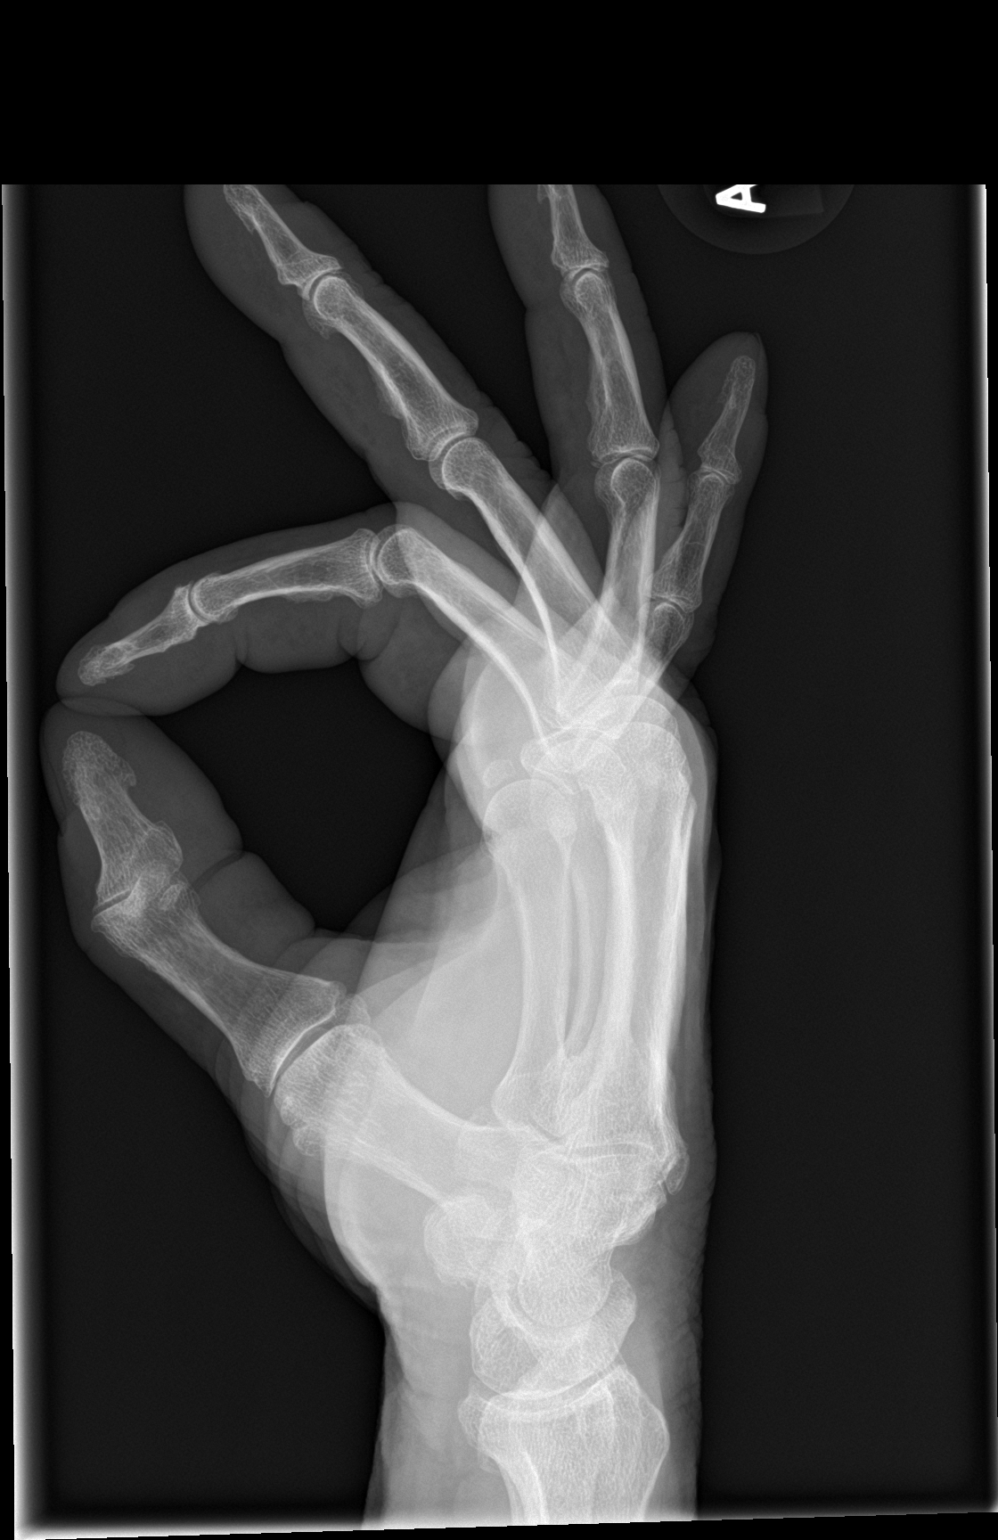

[3 of 3 positions shown; findings below may reference images not displayed]

FINDINGS: No evidence of acute, subacute or healed fractures. Mild narrowing
of the IP joint spaces of the fingers and thumb. Mild narrowing of
the first MCP joint. No associated erosions. Well-preserved bone
mineral density.
IMPRESSION: 1. No acute or subacute osseous abnormality.
2. Mild osteoarthritis.

## 2022-01-30 ENCOUNTER — Encounter: Payer: Self-pay | Admitting: *Deleted

## 2022-02-06 ENCOUNTER — Ambulatory Visit (INDEPENDENT_AMBULATORY_CARE_PROVIDER_SITE_OTHER): Payer: Medicare Other | Admitting: Urology

## 2022-02-06 ENCOUNTER — Encounter: Payer: Self-pay | Admitting: Urology

## 2022-02-06 VITALS — BP 129/76 | HR 73 | Ht 71.0 in | Wt 203.0 lb

## 2022-02-06 DIAGNOSIS — N401 Enlarged prostate with lower urinary tract symptoms: Secondary | ICD-10-CM | POA: Diagnosis not present

## 2022-02-06 DIAGNOSIS — N411 Chronic prostatitis: Secondary | ICD-10-CM | POA: Diagnosis not present

## 2022-02-06 DIAGNOSIS — N138 Other obstructive and reflux uropathy: Secondary | ICD-10-CM | POA: Diagnosis not present

## 2022-02-06 DIAGNOSIS — R350 Frequency of micturition: Secondary | ICD-10-CM | POA: Insufficient documentation

## 2022-02-06 LAB — URINALYSIS, ROUTINE W REFLEX MICROSCOPIC
Bilirubin, UA: NEGATIVE
Glucose, UA: NEGATIVE
Ketones, UA: NEGATIVE
Leukocytes,UA: NEGATIVE
Nitrite, UA: NEGATIVE
Protein,UA: NEGATIVE
Specific Gravity, UA: 1.015 (ref 1.005–1.030)
Urobilinogen, Ur: 0.2 mg/dL (ref 0.2–1.0)
pH, UA: 5.5 (ref 5.0–7.5)

## 2022-02-06 LAB — MICROSCOPIC EXAMINATION
Bacteria, UA: NONE SEEN
Epithelial Cells (non renal): NONE SEEN /hpf (ref 0–10)
Renal Epithel, UA: NONE SEEN /hpf
WBC, UA: NONE SEEN /hpf (ref 0–5)

## 2022-02-06 LAB — BLADDER SCAN AMB NON-IMAGING: Scan Result: 25

## 2022-02-06 MED ORDER — SULFAMETHOXAZOLE-TRIMETHOPRIM 800-160 MG PO TABS: 1.0000 | ORAL_TABLET | Freq: Two times a day (BID) | ORAL | 0 refills | Status: AC

## 2022-02-06 MED ORDER — MIRABEGRON ER 25 MG PO TB24: 25.0000 mg | ORAL_TABLET | Freq: Every day | ORAL | 0 refills | Status: AC

## 2022-02-06 NOTE — Progress Notes (Signed)
Assessment: 1. Chronic prostatitis   2. BPH with obstruction/lower urinary tract symptoms   3. Urinary frequency     Plan: I reviewed the patient's records from his PCP. His exam is consistent with prostatitis.  Diagnosis and management discussed. Bactrim DS BID x 21 days. Rx sent. Samples of Myrbetriq 25 mg daily provided Return to office in 4 weeks with bladder scan Will need PSA after prostatitis has resolved.   Chief Complaint:  Chief Complaint  Patient presents with   Urinary Frequency    History of Present Illness:  Vincent Hudson is a 66 y.o. male who is seen in consultation from Denyce Robert, [place] for evaluation of BPH.  He reports significant lower urinary tract symptoms for approximately 5 years.  He has symptoms of significant daytime frequency, voiding 20 times, nocturia 4-5 times, urgency, occasional urge incontinence.  He also reports an intermittent stream and decreased force of stream.  No dysuria or gross hematuria. [name] currently on tamsulosin 0.4 mg twice daily and finasteride.  He has been on Flomax for approximately 5 years.  He was started on finasteride recently.  No history of UTIs or prostatitis.  No history of kidney stones.  No prior urologic surgery. No recent PSA results.   Past Medical History:  Past Medical History:  Diagnosis Date   Anxiety    Arthritis    Asthma    Chronic back pain    Degenerative disc disease, cervical    and lumbar   GERD (gastroesophageal reflux disease)    History of stomach ulcers    Myocardial infarction ([place]) 2006   PUD (peptic ulcer disease)    bleeding; per patient multiple times in his past.    Past Surgical History:  Past Surgical History:  Procedure Laterality Date   ABDOMINAL SURGERY     "for ulcers"   BIOPSY N/A 11/21/2014   Procedure: BIOPSY;  Surgeon: Danie Binder, MD;  Location: [facility];  Service: Endoscopy;  Laterality: N/A;   CHOLECYSTECTOMY     COLONOSCOPY WITH PROPOFOL N/A 11/21/2014    NLG:XQJJ colon polyps removed/left colon is redundant/large internal hemorrhoids   ESOPHAGOGASTRODUODENOSCOPY (EGD) WITH PROPOFOL N/A 11/21/2014   HER:DEYC non erosive gastritis/dyspepsiadue to IBS   LEG SURGERY Left    "drew fluid out of my leg for infectious arthritis".   POLYPECTOMY N/A 11/21/2014   Procedure: POLYPECTOMY;  Surgeon: Danie Binder, MD;  Location: [facility];  Service: Endoscopy;  Laterality: N/A;    Allergies:  Allergies  Allergen Reactions   Nexium [Esomeprazole] Swelling    Throat swelling   Penicillins Anaphylaxis and Rash    Has patient had a PCN reaction causing immediate rash, facial/tongue/throat swelling, SOB or lightheadedness with hypotension: Yes Has patient had a PCN reaction causing severe rash involving mucus membranes or skin necrosis: Yes Has patient had a PCN reaction that required hospitalization Yes Has patient had a PCN reaction occurring within the last 10 years: No If all of the above answers are "NO", then may proceed with Cephalosporin use.    Aspirin Other (See Comments)    Stomach bleeding   Nsaids Other (See Comments)    Stomach bleeding    Family History:  Family History  Problem Relation Age of Onset   Colon cancer Maternal Uncle    Colon cancer Paternal Uncle     Social History:  Social History   Tobacco Use   Smoking status: Every Day    Packs/day: 0.75  Years: 40.00    Total pack years: 30.00    Types: Cigarettes   Smokeless tobacco: Never  Substance Use Topics   Alcohol use: Yes    Alcohol/week: 12.0 standard drinks of alcohol    Types: 12 Cans of beer per week    Comment: ususally 2 beers a day.   Drug use: No    Review of symptoms:  Constitutional:  Negative for unexplained weight loss, night sweats, fever, chills ENT:  Negative for nose bleeds, sinus pain, painful swallowing CV:  Negative for chest pain, shortness of breath, exercise intolerance, palpitations, loss of consciousness Resp:  Negative for  cough, wheezing, shortness of breath GI:  Negative for nausea, vomiting, diarrhea, bloody stools GU:  Positives noted in HPI; otherwise negative for gross hematuria, dysuria, urinary incontinence Neuro:  Negative for seizures, poor balance, limb weakness, slurred speech Psych:  Negative for lack of energy, depression, anxiety Endocrine:  Negative for polydipsia, polyuria, symptoms of hypoglycemia (dizziness, hunger, sweating) Hematologic:  Negative for anemia, purpura, petechia, prolonged or excessive bleeding, use of anticoagulants  Allergic:  Negative for difficulty breathing or choking as a result of exposure to anything; no shellfish allergy; no allergic response (rash/itch) to materials, foods  Physical exam: BP 129/76   Pulse 73   Ht '5\' 11"'$  (1.803 m)   Wt 203 lb (92.1 kg)   BMI 28.31 kg/m  GENERAL APPEARANCE:  Well appearing, well developed, well nourished, NAD HEENT: Atraumatic, Normocephalic, oropharynx clear. NECK: Supple without lymphadenopathy or thyromegaly. LUNGS: Clear to auscultation bilaterally. HEART: Regular Rate and Rhythm without murmurs, gallops, or rubs. ABDOMEN: Soft, non-tender, No Masses. EXTREMITIES: Moves all extremities well.  Without clubbing, cyanosis, or edema. NEUROLOGIC:  Alert and oriented x 3, normal gait, CN II-XII grossly intact.  MENTAL STATUS:  Appropriate. BACK:  Non-tender to palpation.  No CVAT SKIN:  Warm, dry and intact.   GU: Penis:  uncircumcised Meatus: Normal Scrotum: normal, no masses Testis: normal without masses bilateral Epididymis: normal Prostate: 40 g, tender Rectum: Normal tone,  no masses or tenderness   Results: U/A: Trace blood, 0-2 RBCs  PVR = 25 mL

## 2022-02-06 NOTE — Progress Notes (Signed)
post void residual =68m

## 2022-03-06 ENCOUNTER — Ambulatory Visit: Payer: Medicare Other | Admitting: Urology

## 2022-03-06 NOTE — Progress Notes (Deleted)
Assessment: 1. BPH with obstruction/lower urinary tract symptoms   2. Chronic prostatitis   3. Urinary frequency      Plan: I reviewed the patient's records from his PCP. His exam is consistent with prostatitis.  Diagnosis and management discussed. Bactrim DS BID x 21 days. Rx sent. Samples of Myrbetriq 25 mg daily provided Return to office in 4 weeks with bladder scan Will need PSA after prostatitis has resolved.   Chief Complaint:  No chief complaint on file.   History of Present Illness:  Vincent Hudson is a 66 y.o. male who is seen for further evaluation of prostatitis and BPH.  At his initial visit in August 2023, he reported significant lower urinary tract symptoms for approximately 5 years.  He reported symptoms of significant daytime frequency, voiding 20 times, nocturia 4-5 times, urgency, occasional urge incontinence.  He also reported an intermittent stream and decreased force of stream.  No dysuria or gross hematuria. He was on tamsulosin 0.4 mg twice daily and finasteride.  He had been on Flomax for approximately 5 years.  He was started on finasteride recently.  No history of UTIs or prostatitis.  No history of kidney stones.  No prior urologic surgery. No recent PSA results. PVR = 25 mL. His exam was consistent with prostatitis.  He was treated with Bactrim DS x21 days.  He was also given samples of Myrbetriq 25 mg daily.  Portions of the above documentation were copied from a prior visit for review purposes only.  Past Medical History:  Past Medical History:  Diagnosis Date   Anxiety    Arthritis    Asthma    Chronic back pain    Degenerative disc disease, cervical    and lumbar   GERD (gastroesophageal reflux disease)    History of stomach ulcers    Myocardial infarction ([place]) 2006   PUD (peptic ulcer disease)    bleeding; per patient multiple times in his past.    Past Surgical History:  Past Surgical History:  Procedure Laterality Date    ABDOMINAL SURGERY     "for ulcers"   BIOPSY N/A 11/21/2014   Procedure: BIOPSY;  Surgeon: Danie Binder, MD;  Location: [facility];  Service: Endoscopy;  Laterality: N/A;   CHOLECYSTECTOMY     COLONOSCOPY WITH PROPOFOL N/A 11/21/2014   VHQ:IONG colon polyps removed/left colon is redundant/large internal hemorrhoids   ESOPHAGOGASTRODUODENOSCOPY (EGD) WITH PROPOFOL N/A 11/21/2014   EXB:MWUX non erosive gastritis/dyspepsiadue to IBS   LEG SURGERY Left    "drew fluid out of my leg for infectious arthritis".   POLYPECTOMY N/A 11/21/2014   Procedure: POLYPECTOMY;  Surgeon: Danie Binder, MD;  Location: [facility];  Service: Endoscopy;  Laterality: N/A;    Allergies:  Allergies  Allergen Reactions   Nexium [Esomeprazole] Swelling    Throat swelling   Penicillins Anaphylaxis and Rash    Has patient had a PCN reaction causing immediate rash, facial/tongue/throat swelling, SOB or lightheadedness with hypotension: Yes Has patient had a PCN reaction causing severe rash involving mucus membranes or skin necrosis: Yes Has patient had a PCN reaction that required hospitalization Yes Has patient had a PCN reaction occurring within the last 10 years: No If all of the above answers are "NO", then may proceed with Cephalosporin use.    Aspirin Other (See Comments)    Stomach bleeding   Nsaids Other (See Comments)    Stomach bleeding    Family History:  Family History  Problem Relation  Age of Onset   Colon cancer Maternal Uncle    Colon cancer Paternal Uncle     Social History:  Social History   Tobacco Use   Smoking status: Every Day    Packs/day: 0.75    Years: 40.00    Total pack years: 30.00    Types: Cigarettes   Smokeless tobacco: Never  Substance Use Topics   Alcohol use: Yes    Alcohol/week: 12.0 standard drinks of alcohol    Types: 12 Cans of beer per week    Comment: ususally 2 beers a day.   Drug use: No    ROS: Constitutional:  Negative for fever, chills, weight loss CV:  Negative for chest pain, previous MI, hypertension Respiratory:  Negative for shortness of breath, wheezing, sleep apnea, frequent cough GI:  Negative for nausea, vomiting, bloody stool, GERD  Physical exam: There were no vitals taken for this visit. GENERAL APPEARANCE:  Well appearing, well developed, well nourished, NAD HEENT:  Atraumatic, normocephalic, oropharynx clear NECK:  Supple without lymphadenopathy or thyromegaly ABDOMEN:  Soft, non-tender, no masses EXTREMITIES:  Moves all extremities well, without clubbing, cyanosis, or edema NEUROLOGIC:  Alert and oriented x 3, normal gait, CN II-XII grossly intact MENTAL STATUS:  appropriate BACK:  Non-tender to palpation, No CVAT SKIN:  Warm, dry, and intact   Results: U/A:

## 2022-06-10 ENCOUNTER — Other Ambulatory Visit (HOSPITAL_COMMUNITY): Payer: Self-pay | Admitting: Pain Medicine

## 2022-06-10 DIAGNOSIS — M5136 Other intervertebral disc degeneration, lumbar region: Secondary | ICD-10-CM

## 2022-06-10 DIAGNOSIS — M5134 Other intervertebral disc degeneration, thoracic region: Secondary | ICD-10-CM

## 2022-06-10 DIAGNOSIS — M5417 Radiculopathy, lumbosacral region: Secondary | ICD-10-CM

## 2022-07-03 ENCOUNTER — Ambulatory Visit (HOSPITAL_COMMUNITY)
Admission: RE | Admit: 2022-07-03 | Discharge: 2022-07-03 | Disposition: A | Discharge status: Home / Self Care | Payer: Medicare Other | Source: Ambulatory Visit | Attending: Pain Medicine | Admitting: Pain Medicine

## 2022-07-03 DIAGNOSIS — M5136 Other intervertebral disc degeneration, lumbar region: Secondary | ICD-10-CM | POA: Insufficient documentation

## 2022-07-03 DIAGNOSIS — M5417 Radiculopathy, lumbosacral region: Secondary | ICD-10-CM | POA: Diagnosis not present

## 2022-07-03 DIAGNOSIS — M5134 Other intervertebral disc degeneration, thoracic region: Secondary | ICD-10-CM | POA: Insufficient documentation

## 2022-07-15 ENCOUNTER — Ambulatory Visit (HOSPITAL_COMMUNITY)
Admission: RE | Admit: 2022-07-15 | Discharge: 2022-07-15 | Disposition: A | Discharge status: Home / Self Care | Payer: 59 | Source: Ambulatory Visit | Attending: Adult Health | Admitting: Adult Health

## 2022-07-15 ENCOUNTER — Other Ambulatory Visit (HOSPITAL_COMMUNITY): Payer: Self-pay | Admitting: Adult Health

## 2022-07-15 DIAGNOSIS — R058 Other specified cough: Secondary | ICD-10-CM | POA: Diagnosis not present

## 2022-08-28 ENCOUNTER — Encounter: Payer: Self-pay | Admitting: Radiology

## 2022-12-10 ENCOUNTER — Other Ambulatory Visit (HOSPITAL_COMMUNITY): Payer: Self-pay | Admitting: Adult Health

## 2022-12-10 DIAGNOSIS — Z122 Encounter for screening for malignant neoplasm of respiratory organs: Secondary | ICD-10-CM

## 2023-02-25 ENCOUNTER — Ambulatory Visit (HOSPITAL_COMMUNITY)
Admission: RE | Admit: 2023-02-25 | Discharge: 2023-02-25 | Disposition: A | Discharge status: Home / Self Care | Payer: 59 | Source: Ambulatory Visit | Attending: Adult Health | Admitting: Adult Health

## 2023-02-25 DIAGNOSIS — J439 Emphysema, unspecified: Secondary | ICD-10-CM | POA: Insufficient documentation

## 2023-02-25 DIAGNOSIS — I700 Atherosclerosis of aorta: Secondary | ICD-10-CM | POA: Insufficient documentation

## 2023-02-25 DIAGNOSIS — F17210 Nicotine dependence, cigarettes, uncomplicated: Secondary | ICD-10-CM | POA: Insufficient documentation

## 2023-02-25 DIAGNOSIS — Z122 Encounter for screening for malignant neoplasm of respiratory organs: Secondary | ICD-10-CM | POA: Insufficient documentation

## 2023-02-25 DIAGNOSIS — I2510 Atherosclerotic heart disease of native coronary artery without angina pectoris: Secondary | ICD-10-CM | POA: Diagnosis not present

## 2023-05-16 IMAGING — DX DG SHOULDER 2+V*R*
3 series · 3 of 3 positions shown · non-contrast
Comparison: Radiograph 10/20/2019

CLINICAL DATA: Right shoulder pain. Chronic pain with decreased
range of motion.

EXAM:
RIGHT SHOULDER - 2+ VIEW

[shoulder grashey]
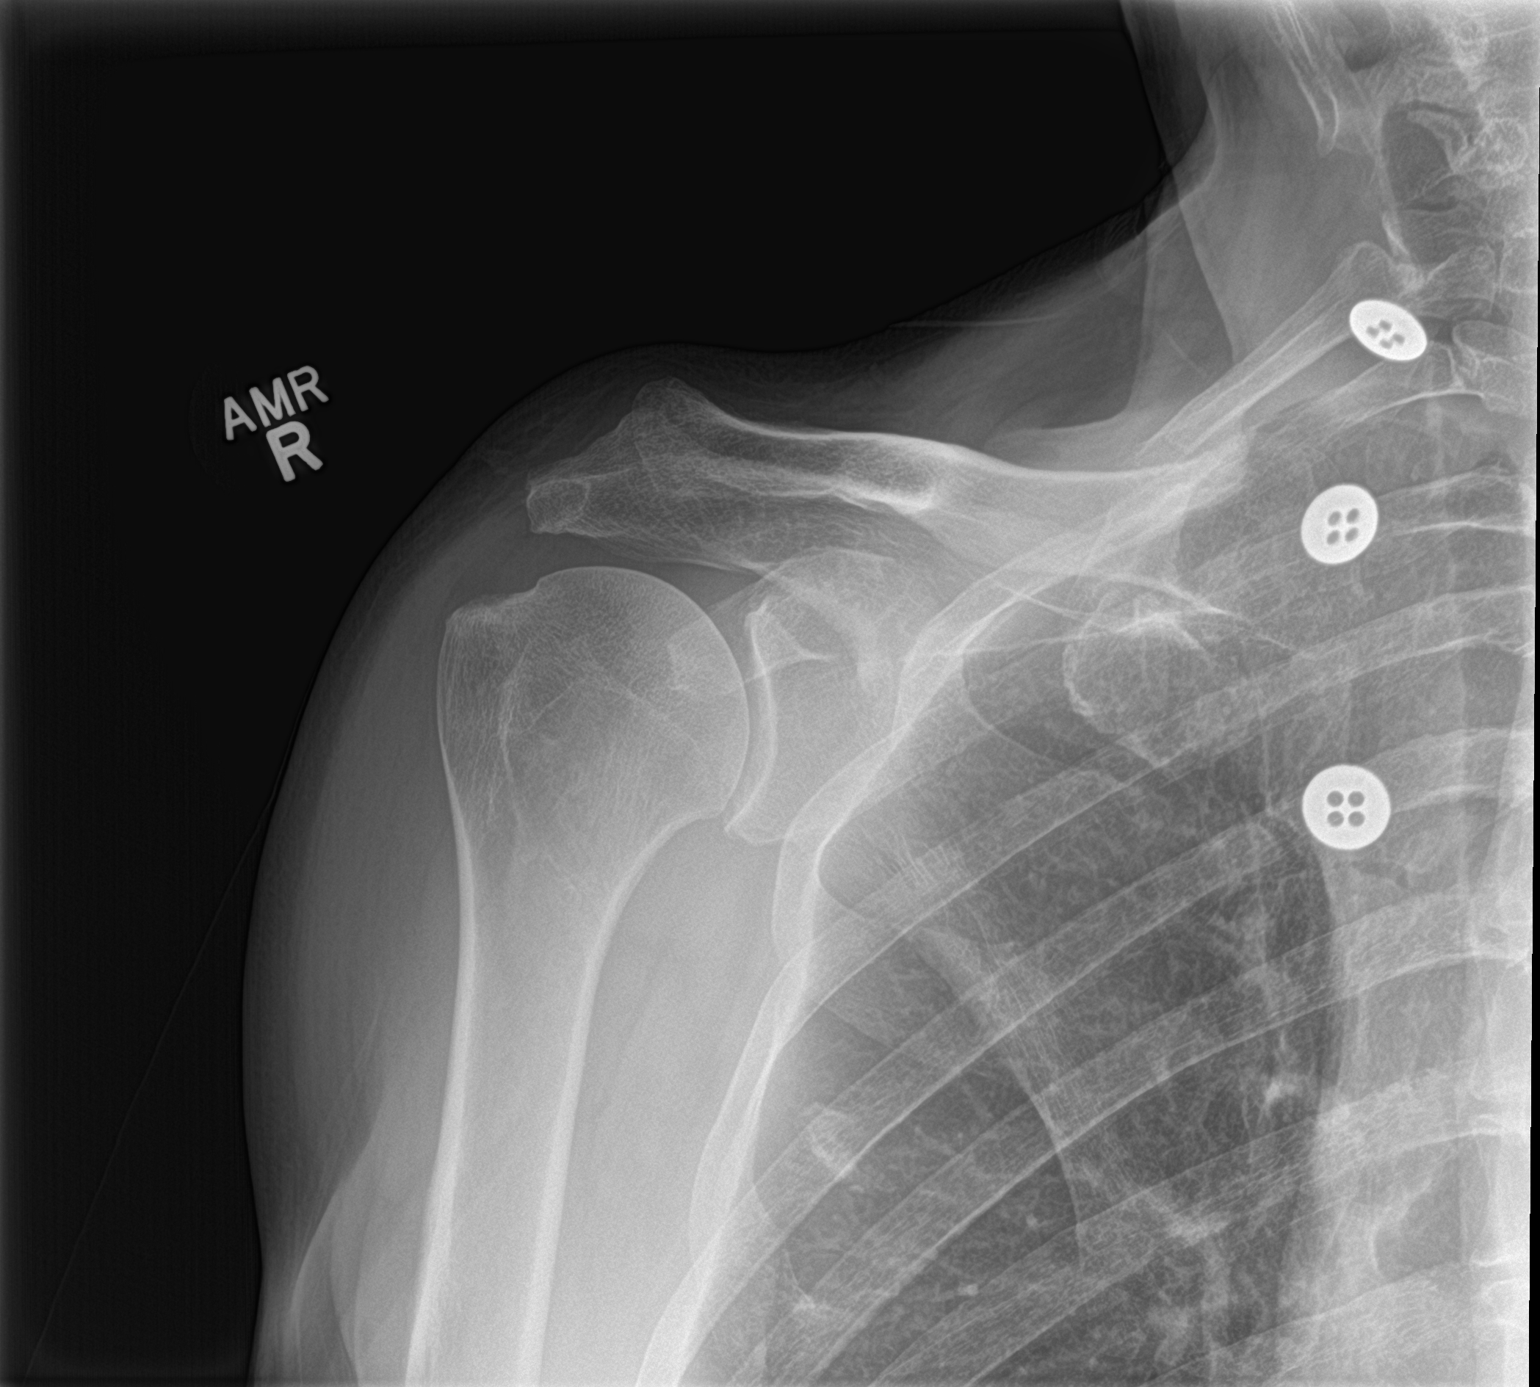

[shoulder y view]
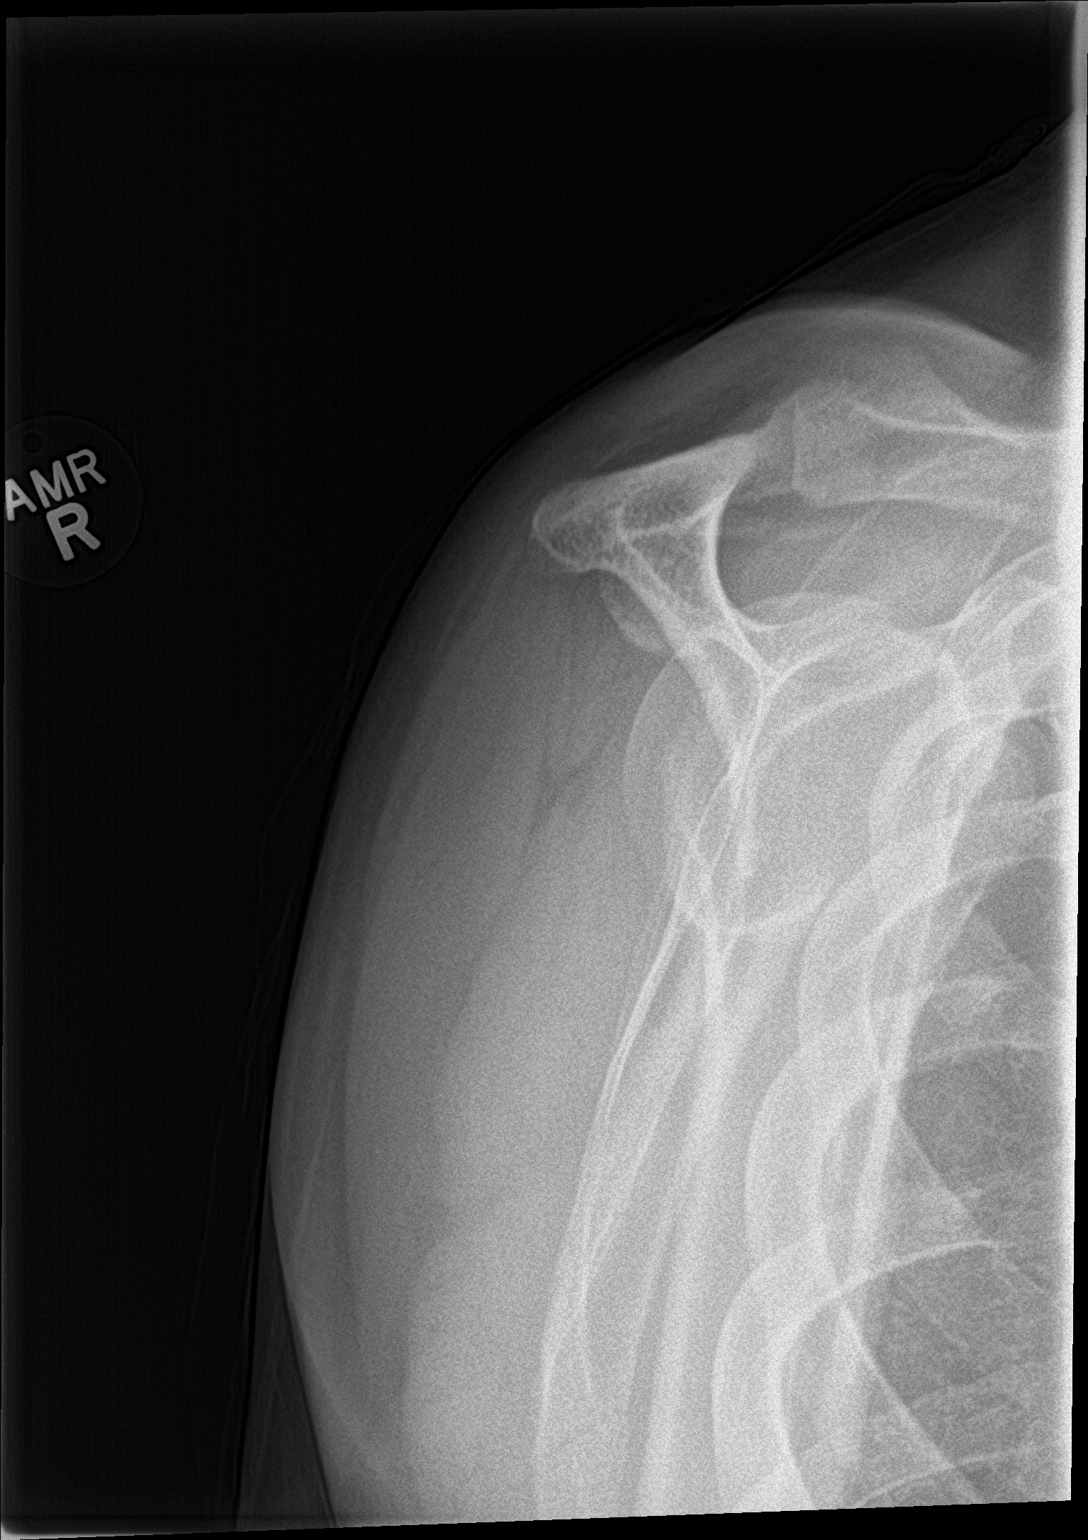

[shoulder axillary]
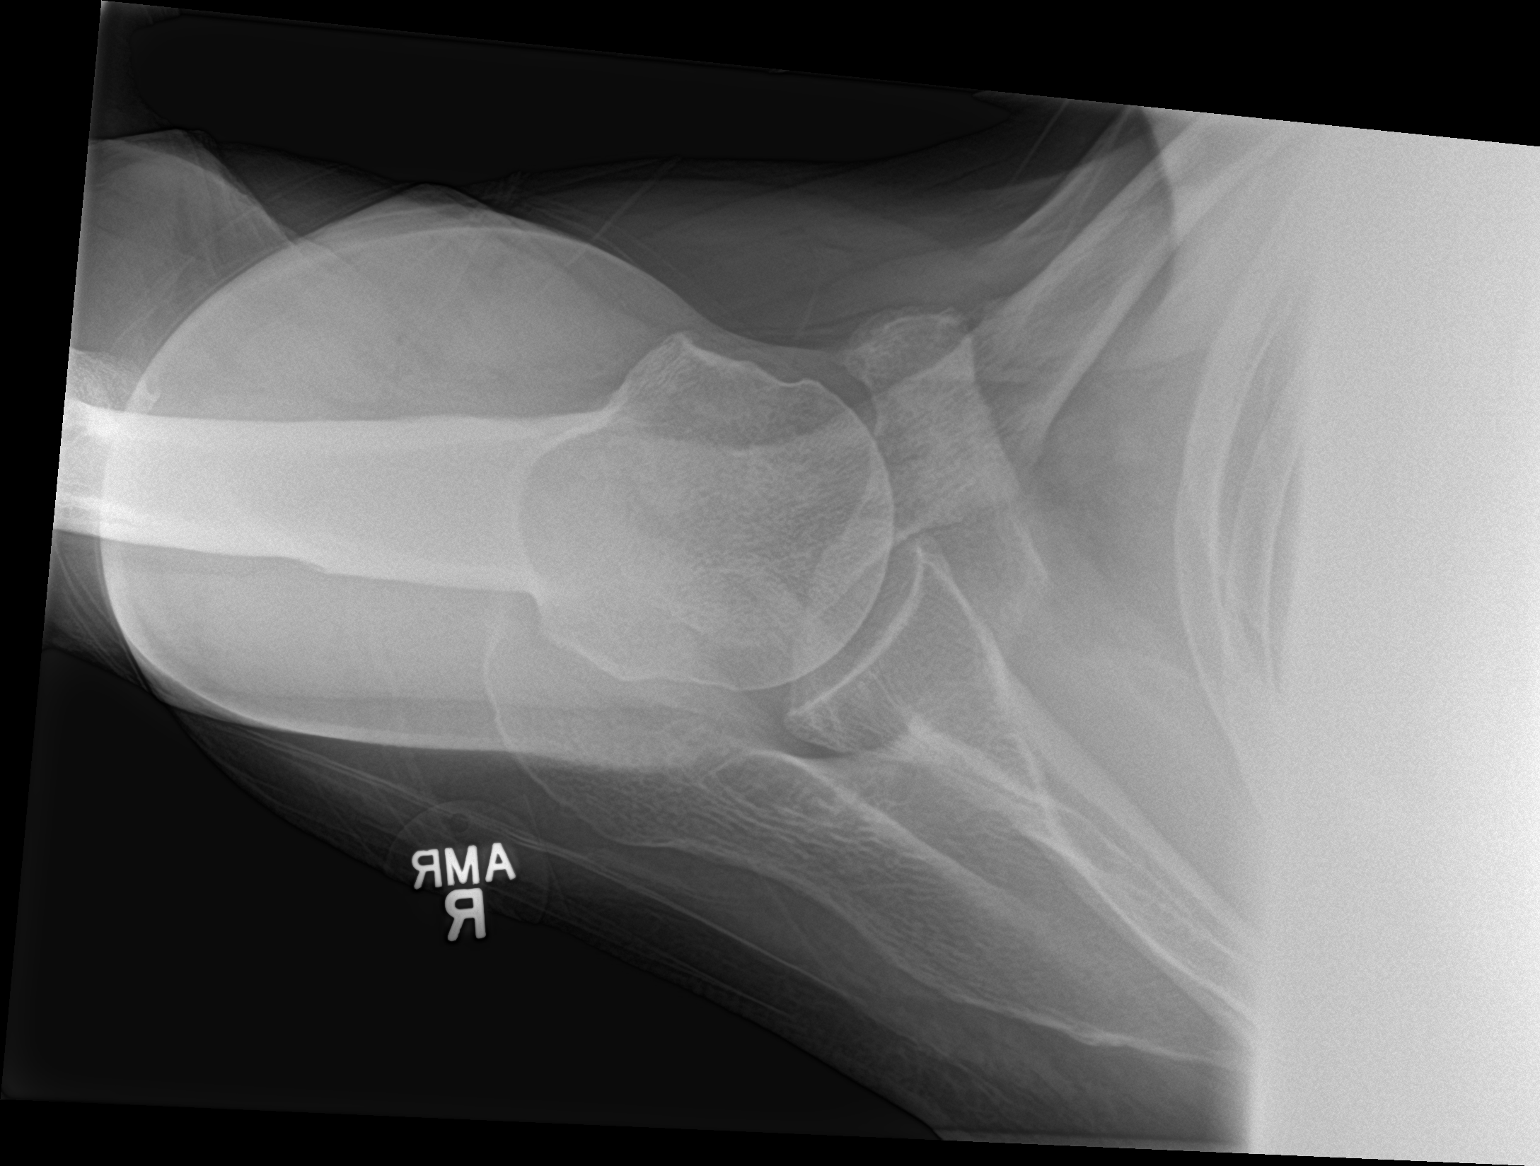

[3 of 3 positions shown; findings below may reference images not displayed]

FINDINGS: Similar appearing acromioclavicular degenerative change with
spurring and subchondral cysts. Small inferior glenohumeral spurs,
also unchanged. No fracture. Normal alignment. No evidence of
erosion, avascular necrosis or focal bone lesion. No soft tissue
calcifications.
IMPRESSION: Unchanged acromioclavicular and glenohumeral degenerative change.

## 2023-06-03 ENCOUNTER — Encounter: Payer: Self-pay | Admitting: *Deleted

## 2023-06-29 ENCOUNTER — Encounter: Payer: Self-pay | Admitting: *Deleted

## 2023-08-20 ENCOUNTER — Observation Stay (HOSPITAL_COMMUNITY): Payer: 59

## 2023-08-20 ENCOUNTER — Emergency Department (HOSPITAL_COMMUNITY): Payer: 59

## 2023-08-20 ENCOUNTER — Other Ambulatory Visit (HOSPITAL_COMMUNITY): Payer: Self-pay | Admitting: *Deleted

## 2023-08-20 ENCOUNTER — Encounter (HOSPITAL_COMMUNITY): Payer: Self-pay | Admitting: Emergency Medicine

## 2023-08-20 ENCOUNTER — Observation Stay (HOSPITAL_COMMUNITY)
Admission: EM | Admit: 2023-08-20 | Discharge: 2023-08-20 | Disposition: A | Discharge status: Home / Self Care | Payer: 59 | Attending: Family Medicine | Admitting: Family Medicine

## 2023-08-20 ENCOUNTER — Other Ambulatory Visit: Payer: Self-pay

## 2023-08-20 DIAGNOSIS — R0602 Shortness of breath: Secondary | ICD-10-CM | POA: Diagnosis not present

## 2023-08-20 DIAGNOSIS — Z5321 Procedure and treatment not carried out due to patient leaving prior to being seen by health care provider: Secondary | ICD-10-CM | POA: Insufficient documentation

## 2023-08-20 DIAGNOSIS — Z1152 Encounter for screening for COVID-19: Secondary | ICD-10-CM | POA: Insufficient documentation

## 2023-08-20 DIAGNOSIS — F17210 Nicotine dependence, cigarettes, uncomplicated: Secondary | ICD-10-CM | POA: Diagnosis not present

## 2023-08-20 DIAGNOSIS — M5416 Radiculopathy, lumbar region: Secondary | ICD-10-CM | POA: Diagnosis not present

## 2023-08-20 DIAGNOSIS — I10 Essential (primary) hypertension: Secondary | ICD-10-CM | POA: Insufficient documentation

## 2023-08-20 DIAGNOSIS — J4489 Other specified chronic obstructive pulmonary disease: Secondary | ICD-10-CM | POA: Diagnosis not present

## 2023-08-20 DIAGNOSIS — N400 Enlarged prostate without lower urinary tract symptoms: Secondary | ICD-10-CM | POA: Insufficient documentation

## 2023-08-20 DIAGNOSIS — J449 Chronic obstructive pulmonary disease, unspecified: Secondary | ICD-10-CM | POA: Diagnosis not present

## 2023-08-20 DIAGNOSIS — R0789 Other chest pain: Secondary | ICD-10-CM | POA: Diagnosis not present

## 2023-08-20 DIAGNOSIS — R079 Chest pain, unspecified: Secondary | ICD-10-CM | POA: Diagnosis not present

## 2023-08-20 DIAGNOSIS — G8929 Other chronic pain: Secondary | ICD-10-CM | POA: Insufficient documentation

## 2023-08-20 LAB — CBC WITH DIFFERENTIAL/PLATELET
Abs Immature Granulocytes: 0.04 10*3/uL (ref 0.00–0.07)
Basophils Absolute: 0.1 10*3/uL (ref 0.0–0.1)
Basophils Relative: 1 %
Eosinophils Absolute: 0.2 10*3/uL (ref 0.0–0.5)
Eosinophils Relative: 2 %
HCT: 43.7 % (ref 39.0–52.0)
Hemoglobin: 14.9 g/dL (ref 13.0–17.0)
Immature Granulocytes: 0 %
Lymphocytes Relative: 16 %
Lymphs Abs: 1.6 10*3/uL (ref 0.7–4.0)
MCH: 33 pg (ref 26.0–34.0)
MCHC: 34.1 g/dL (ref 30.0–36.0)
MCV: 96.9 fL (ref 80.0–100.0)
Monocytes Absolute: 0.8 10*3/uL (ref 0.1–1.0)
Monocytes Relative: 8 %
Neutro Abs: 7.3 10*3/uL (ref 1.7–7.7)
Neutrophils Relative %: 73 %
Platelets: 278 10*3/uL (ref 150–400)
RBC: 4.51 MIL/uL (ref 4.22–5.81)
RDW: 12.4 % (ref 11.5–15.5)
WBC: 10.1 10*3/uL (ref 4.0–10.5)
nRBC: 0 % (ref 0.0–0.2)

## 2023-08-20 LAB — COMPREHENSIVE METABOLIC PANEL
ALT: 31 U/L (ref 0–44)
AST: 27 U/L (ref 15–41)
Albumin: 3.7 g/dL (ref 3.5–5.0)
Alkaline Phosphatase: 70 U/L (ref 38–126)
Anion gap: 10 (ref 5–15)
BUN: 13 mg/dL (ref 8–23)
CO2: 28 mmol/L (ref 22–32)
Calcium: 9.2 mg/dL (ref 8.9–10.3)
Chloride: 105 mmol/L (ref 98–111)
Creatinine, Ser: 1.06 mg/dL (ref 0.61–1.24)
GFR, Estimated: >60 mL/min (ref >60)
Glucose, Bld: 141 mg/dL — ABNORMAL HIGH (ref 70–99)
Potassium: 3.9 mmol/L (ref 3.5–5.1)
Sodium: 143 mmol/L (ref 135–145)
Total Bilirubin: 0.7 mg/dL (ref 0.0–1.2)
Total Protein: 7.1 g/dL (ref 6.5–8.1)

## 2023-08-20 LAB — TSH: TSH: 1.825 u[IU]/mL (ref 0.350–4.500)

## 2023-08-20 LAB — TROPONIN I (HIGH SENSITIVITY)
Troponin I (High Sensitivity): 12 ng/L (ref <18)
Troponin I (High Sensitivity): 13 ng/L (ref <18)

## 2023-08-20 LAB — RESP PANEL BY RT-PCR (RSV, FLU A&B, COVID)  RVPGX2
Influenza A by PCR: NEGATIVE
Influenza B by PCR: NEGATIVE
Resp Syncytial Virus by PCR: NEGATIVE
SARS Coronavirus 2 by RT PCR: NEGATIVE

## 2023-08-20 LAB — BRAIN NATRIURETIC PEPTIDE: B Natriuretic Peptide: 86 pg/mL (ref 0.0–100.0)

## 2023-08-20 LAB — HIV ANTIBODY (ROUTINE TESTING W REFLEX): HIV Screen 4th Generation wRfx: NONREACTIVE

## 2023-08-20 MED ORDER — ASPIRIN 81 MG PO CHEW
324.0000 mg | CHEWABLE_TABLET | Freq: Once | ORAL | Status: AC
  Administered 2023-08-20: 324 mg via ORAL
  Filled 2023-08-20: qty 4

## 2023-08-20 MED ORDER — LABETALOL HCL 5 MG/ML IV SOLN: 10.0000 mg | INTRAVENOUS | Status: DC | PRN

## 2023-08-20 MED ORDER — MORPHINE SULFATE (PF) 2 MG/ML IV SOLN: 2.0000 mg | INTRAVENOUS | Status: DC | PRN

## 2023-08-20 MED ORDER — ENOXAPARIN SODIUM 40 MG/0.4ML IJ SOSY: 40.0000 mg | PREFILLED_SYRINGE | INTRAMUSCULAR | Status: DC

## 2023-08-20 MED ORDER — NICOTINE 21 MG/24HR TD PT24: 21.0000 mg | MEDICATED_PATCH | Freq: Every day | TRANSDERMAL | Status: DC

## 2023-08-20 MED ORDER — IOHEXOL 350 MG/ML SOLN
100.0000 mL | Freq: Once | INTRAVENOUS | Status: AC | PRN
  Administered 2023-08-20: 100 mL via INTRAVENOUS

## 2023-08-20 MED ORDER — ALUM & MAG HYDROXIDE-SIMETH 200-200-20 MG/5ML PO SUSP
30.0000 mL | Freq: Once | ORAL | Status: AC
  Administered 2023-08-20: 30 mL via ORAL
  Filled 2023-08-20: qty 30

## 2023-08-20 MED ORDER — ACETAMINOPHEN 325 MG PO TABS: 650.0000 mg | ORAL_TABLET | Freq: Four times a day (QID) | ORAL | Status: DC | PRN

## 2023-08-20 MED ORDER — MORPHINE SULFATE (PF) 4 MG/ML IV SOLN
4.0000 mg | Freq: Once | INTRAVENOUS | Status: AC
  Administered 2023-08-20: 4 mg via INTRAVENOUS
  Filled 2023-08-20: qty 1

## 2023-08-20 MED ORDER — ONDANSETRON HCL 4 MG/2ML IJ SOLN
4.0000 mg | Freq: Once | INTRAMUSCULAR | Status: AC
  Administered 2023-08-20: 4 mg via INTRAVENOUS
  Filled 2023-08-20: qty 2

## 2023-08-20 MED ORDER — HYDRALAZINE HCL 20 MG/ML IJ SOLN: 10.0000 mg | Freq: Four times a day (QID) | INTRAMUSCULAR | Status: DC | PRN

## 2023-08-20 MED ORDER — NITROGLYCERIN 0.4 MG SL SUBL: 0.4000 mg | SUBLINGUAL_TABLET | SUBLINGUAL | Status: DC | PRN

## 2023-08-20 MED ORDER — HYDROCODONE-ACETAMINOPHEN 5-325 MG PO TABS: 1.0000 | ORAL_TABLET | Freq: Four times a day (QID) | ORAL | Status: DC | PRN

## 2023-08-20 NOTE — Consult Note (Signed)
Cardiology Consultation   Patient ID: Vincent Hudson MRN: [id number]; DOB: 1955/12/19  Admit date: 08/20/2023 Date of Consult: 08/20/2023  PCP:  Kara Pacer, NP   [facility] Providers Cardiologist:  New     Patient Profile:   Vincent Hudson is a 68 y.o. male with a hx of self reported CAD, chronic back pain, PUD, HTN, tobacco abuse who is being seen 08/20/2023 for the evaluation of chest pain at the request of Dr Rennis Chris.  History of Present Illness:   Vincent Hudson 68 yo male history of chronic back pain, PUD, HTN, self reported history of CAD, presents with chest pain.   He reports a year of intermittent chest pains but significant progression today. Sharp/pressure midchest/epgiastrium up into left chest and left side of neck. 5/10 in severity, some associated SOB. Worst with coughing, positioning, moving left arm. Constant pain since onset at 8AM ( at my interview 6.5 hours now), did improve with morphine.   Self reported history of "mini heart attacks" 20 years ago but states never had a cath. Did have stress test in the past. Unclear details.    K 3.9 Cr 1.6 BUN 13 BNP 86 WBC 10.1 Hgb 14.9 Plt 278 Trop 12--> EKG SR, PACs. Nonspecific ST/T changes CXR no acute process CT PE: no PE. +coronary artery calcifications    Past Medical History:  Diagnosis Date   Anxiety    Arthritis    Asthma    Chronic back pain    Degenerative disc disease, cervical    and lumbar   GERD (gastroesophageal reflux disease)    History of stomach ulcers    Myocardial infarction ([facility]) 2006   PUD (peptic ulcer disease)    bleeding; per patient multiple times in his past.    Past Surgical History:  Procedure Laterality Date   ABDOMINAL SURGERY     "for ulcers"   BIOPSY N/A 11/21/2014   Procedure: BIOPSY;  Surgeon: West Bali, MD;  Location: [facility];  Service: Endoscopy;  Laterality: N/A;   CHOLECYSTECTOMY     COLONOSCOPY WITH PROPOFOL N/A 11/21/2014    QMV:HQIO colon polyps removed/left colon is redundant/large internal hemorrhoids   ESOPHAGOGASTRODUODENOSCOPY (EGD) WITH PROPOFOL N/A 11/21/2014   NGE:XBMW non erosive gastritis/dyspepsiadue to IBS   LEG SURGERY Left    "drew fluid out of my leg for infectious arthritis".   POLYPECTOMY N/A 11/21/2014   Procedure: POLYPECTOMY;  Surgeon: West Bali, MD;  Location: [facility];  Service: Endoscopy;  Laterality: N/A;      Inpatient Medications: Scheduled Meds:  Continuous Infusions:  PRN Meds: nitroGLYCERIN  Allergies:    Allergies  Allergen Reactions   Nexium [Esomeprazole] Swelling    Throat swelling   Penicillins Anaphylaxis and Rash    Has patient had a PCN reaction causing immediate rash, facial/tongue/throat swelling, SOB or lightheadedness with hypotension: Yes Has patient had a PCN reaction causing severe rash involving mucus membranes or skin necrosis: Yes Has patient had a PCN reaction that required hospitalization Yes Has patient had a PCN reaction occurring within the last 10 years: No If all of the above answers are "NO", then may proceed with Cephalosporin use.    Aspirin Other (See Comments)    Stomach bleeding   Nsaids Other (See Comments)    Stomach bleeding    Social History:   Social History   Socioeconomic History   Marital status: Divorced    Spouse name: Not on file   Number of  children: Not on file   Years of education: Not on file   Highest education level: Not on file  Occupational History   Not on file  Tobacco Use   Smoking status: Every Day    Current packs/day: 0.75    Average packs/day: 0.8 packs/day for 40.0 years (30.0 ttl pk-yrs)    Types: Cigarettes   Smokeless tobacco: Never  Substance and Sexual Activity   Alcohol use: Yes    Alcohol/week: 12.0 standard drinks of alcohol    Types: 12 Cans of beer per week    Comment: ususally 2 beers a day.   Drug use: No   Sexual activity: Yes    Birth control/protection: None  Other Topics  Concern   Not on file  Social History Narrative   Not on file   Social Drivers of Health   Financial Resource Strain: Not on file  Food Insecurity: Not on file  Transportation Needs: Not on file  Physical Activity: Not on file  Stress: Not on file  Social Connections: Unknown (02/24/2022)   Received from [facility], [facility]   Social Network    Social Network: Not on file  Intimate Partner Violence: Unknown (02/24/2022)   Received from [facility], [facility]   HITS    Physically Hurt: Not on file    Insult or Talk Down To: Not on file    Threaten Physical Harm: Not on file    Scream or Curse: Not on file    Family History:    Family History  Problem Relation Age of Onset   Colon cancer Maternal Uncle    Colon cancer Paternal Uncle      ROS:  Please see the history of present illness.   All other ROS reviewed and negative.     Physical Exam/Data:   Vitals:   08/20/23 1116 08/20/23 1200 08/20/23 1215 08/20/23 1230  BP:  134/80 139/74 117/80  Pulse:  83 80 82  Resp:  (!) 23 19 15   Temp:      TempSrc:      SpO2:   97% 94%  Weight: 97.5 kg     Height: 5\' 11"  (1.803 m)      No intake or output data in the 24 hours ending 08/20/23 1358    08/20/2023   11:16 AM 02/06/2022   10:54 AM 03/07/2021    9:41 PM  Last 3 Weights  Weight (lbs) 215 lb 203 lb 201 lb 1 oz  Weight (kg) 97.523 kg 92.08 kg 91.201 kg     Body mass index is 29.99 kg/m.  General:  Well nourished, well developed, in no acute distress HEENT: normal Neck: no JVD Vascular: No carotid bruits; Distal pulses 2+ bilaterally Cardiac:  normal S1, S2; RRR; no murmur  Lungs:  clear to auscultation bilaterally, no wheezing, rhonchi or rales  Abd: soft, nontender, no hepatomegaly  Ext: no edema Musculoskeletal:  No deformities, BUE and BLE strength normal and equal Skin: warm and dry  Neuro:  CNs 2-12 intact, no focal abnormalities noted Psych:  Normal affect     Laboratory Data:  High  Sensitivity Troponin:   Recent Labs  Lab 08/20/23 1142  TROPONINIHS 12     Chemistry Recent Labs  Lab 08/20/23 1142  NA 143  K 3.9  CL 105  CO2 28  GLUCOSE 141*  BUN 13  CREATININE 1.06  CALCIUM 9.2  GFRNONAA >60  ANIONGAP 10    Recent Labs  Lab 08/20/23 1142  PROT 7.1  ALBUMIN 3.7  AST 27  ALT 31  ALKPHOS 70  BILITOT 0.7   Lipids No results for input(s): "CHOL", "TRIG", "HDL", "LABVLDL", "LDLCALC", "CHOLHDL" in the last 168 hours.  Hematology Recent Labs  Lab 08/20/23 1142  WBC 10.1  RBC 4.51  HGB 14.9  HCT 43.7  MCV 96.9  MCH 33.0  MCHC 34.1  RDW 12.4  PLT 278   Thyroid No results for input(s): "TSH", "FREET4" in the last 168 hours.  BNP Recent Labs  Lab 08/20/23 1142  BNP 86.0    DDimer No results for input(s): "DDIMER" in the last 168 hours.   Radiology/Studies:  CT Angio Chest PE W/Cm &/Or Wo Cm Result Date: 08/20/2023 CLINICAL DATA:  Chest pain.  Shortness of breath. EXAM: CT ANGIOGRAPHY CHEST WITH CONTRAST TECHNIQUE: Multidetector CT imaging of the chest was performed using the standard protocol during bolus administration of intravenous contrast. Multiplanar CT image reconstructions and MIPs were obtained to evaluate the vascular anatomy. RADIATION DOSE REDUCTION: This exam was performed according to the departmental dose-optimization program which includes automated exposure control, adjustment of the mA and/or kV according to patient size and/or use of iterative reconstruction technique. CONTRAST:  OMNIPAQUE IOHEXOL 350 MG/ML SOLN COMPARISON:  February 25, 2023. FINDINGS: Cardiovascular: Satisfactory opacification of the pulmonary arteries to the segmental level. No evidence of pulmonary embolism. Normal heart size. No pericardial effusion. Coronary artery calcifications are noted. Mediastinum/Nodes: No enlarged mediastinal, hilar, or axillary lymph nodes. Thyroid gland, trachea, and esophagus demonstrate no significant findings. Lungs/Pleura:  No pneumothorax or pleural effusion is noted. No acute pulmonary disease is noted. Upper Abdomen: No acute abnormality. Musculoskeletal: No chest wall abnormality. No acute or significant osseous findings. Review of the MIP images confirms the above findings. IMPRESSION: No definite evidence of pulmonary embolus. Coronary artery calcifications are noted suggesting coronary artery disease. Aortic Atherosclerosis (ICD10-I70.0). Electronically Signed   By: Lupita Raider M.D.   On: 08/20/2023 13:32   DG Chest Port 1 View Result Date: 08/20/2023 CLINICAL DATA:  Chest pain. EXAM: PORTABLE CHEST 1 VIEW COMPARISON:  Chest radiograph dated 07/15/2022. CT chest dated 02/25/2023. FINDINGS: The heart size and mediastinal contours are within normal limits. Aortic atherosclerosis. Mild hyperinflation. No focal consolidation, pleural effusion, or pneumothorax. Multiple remote healed left rib fractures. No acute osseous abnormality. IMPRESSION: No acute cardiopulmonary findings. Electronically Signed   By: Hart Robinsons M.D.   On: 08/20/2023 11:49     Assessment and Plan:   1.Chest pain - atypical in sense that its positional and constant for 6.5 hours, though varies in severity. Some improvement with morphine - no objective evidence of ischemia thus far by EKG or enzymes - he does have CAD risk factors with long smoking history, age, HTN. Does have some SOB associated with pain.  - plan for echo - pending results consider noninvasive testing tomorrow, make npo tonight.      For questions or updates, please contact [facility] Please consult [email or web address] for contact info under    Signed, Dina Rich, MD  08/20/2023 1:58 PM

## 2023-08-20 NOTE — ED Notes (Signed)
Patient came out of room to nurses station and stated he wanted his IV out and he was leaving. Echo at the bedside but patient refused testing. RN notified Dezii via secure chat and paged. Patient refused to wait for Doctor before leaving. Patient removed his IV by himself before RN could come assist. Bleeding controlled. Charge nurse and this RN spoke to patient about leaving AMA and risks and he should wait for the doctor to discuss but patient refused and signed AMA paperwork. Patient verbalized understanding of leaving AMA. Patient walked out of ER with steady gait independently. A+Ox4.

## 2023-08-20 NOTE — ED Provider Notes (Signed)
[place] EMERGENCY DEPARTMENT AT [facility] Provider Note   CSN: [id number] Arrival date & time: 08/20/23  1107     History  Chief Complaint  Patient presents with   Chest Pain   Shortness of Breath    Vincent Hudson is a 68 y.o. male.  Patient is a 68 year old male with a past medical history of hypertension, peptic ulcer disease, tobacco use who presents emergency department the chief complaint of left-sided chest pain which has been ongoing since this morning.  He does have a history of "mild MI" but notes that this was approximately 15 to 20 years ago.  [name] not currently followed by cardiology at this point.  He does note that symptoms are worse with exertion and only improved mildly with rest.  He notes that the pain radiates to the left side of his neck.  He denies any associated recent cold-like symptoms to include cough, congestion, rhinorrhea, sore throat.  He denies any associated edema.  He has had no associated hemoptysis and no history of DVT or PE.  He does note that he is having associated dyspnea on exertion as well.  He denies any dizziness, lightheadedness, syncope.   Chest Pain Associated symptoms: shortness of breath   Shortness of Breath Associated symptoms: chest pain        Home Medications Prior to Admission medications   Medication Sig Start Date End Date Taking? Authorizing Provider  albuterol (PROVENTIL HFA;VENTOLIN HFA) 108 (90 BASE) MCG/ACT inhaler Inhale 2 puffs into the lungs every 6 (six) hours as needed. For shortness of breath    [provider]  albuterol (PROVENTIL) (2.5 MG/3ML) 0.083% nebulizer solution Take 2.5 mg by nebulization every 6 (six) hours as needed for wheezing or shortness of breath.    [provider]  ALPRAZolam Prudy Feeler) 0.5 MG tablet Take 0.5 mg by mouth 3 (three) times daily as needed. For anxiety and nervous stomach    [provider]  clindamycin (CLEOCIN) 300 MG capsule Take 300 mg  by mouth 3 (three) times daily. Patient not taking: Reported on 02/06/2022 01/06/22   [provider]  dicyclomine (BENTYL) 10 MG capsule 1 PO 30 MINUTES PRIOR TO BREAKFAST AND LUNCH to treat diarrhea and abdominal pain. 11/21/14   Fields, Darleene Cleaver, MD  finasteride (PROSCAR) 5 MG tablet Take 5 mg by mouth daily. 01/21/22   [provider]  gabapentin (NEURONTIN) 300 MG capsule Take 300 mg by mouth 3 (three) times daily. 11/24/19   [provider]  lisinopril-hydrochlorothiazide (ZESTORETIC) 20-12.5 MG tablet Take 1 tablet by mouth daily. 01/21/22   [provider]  mirabegron ER (MYRBETRIQ) 25 MG TB24 tablet Take 1 tablet (25 mg total) by mouth daily. 02/06/22   Stoneking, Danford Bad., MD  naproxen (NAPROSYN) 500 MG tablet Take 500 mg by mouth 2 (two) times daily. 11/24/19   [provider]  oxyCODONE-acetaminophen (PERCOCET) 10-325 MG tablet Take 1 tablet by mouth every 4 (four) hours as needed for pain.    [provider]  pantoprazole (PROTONIX) 40 MG tablet Take 40 mg by mouth daily. 11/24/19   [provider]  tamsulosin (FLOMAX) 0.4 MG CAPS capsule Take 0.8 mg by mouth daily. 12/26/21   [provider]  TRELEGY ELLIPTA 100-62.5-25 MCG/INH AEPB Take 1 puff by mouth daily. 11/24/19   [provider]      Allergies    Nexium [esomeprazole], Penicillins, Aspirin, and Nsaids    Review of Systems  Review of Systems  Respiratory:  Positive for shortness of breath.   Cardiovascular:  Positive for chest pain.  All other systems reviewed and are negative.   Physical Exam Updated Vital Signs BP 133/67   Pulse (!) 50   Temp 97.9 F (36.6 C) (Oral)   Resp (!) 24   Ht 5\' 11"  (1.803 m)   Wt 97.5 kg   SpO2 94%   BMI 29.99 kg/m  Physical Exam Vitals reviewed.  Constitutional:      Appearance: Normal appearance.  HENT:     Head: Normocephalic and atraumatic.     Nose: Nose normal.     Mouth/Throat:     Mouth: Mucous  membranes are moist.  Eyes:     Extraocular Movements: Extraocular movements intact.     Conjunctiva/sclera: Conjunctivae normal.     Pupils: Pupils are equal, round, and reactive to light.  Cardiovascular:     Rate and Rhythm: Normal rate and regular rhythm.     Pulses: Normal pulses.     Heart sounds: Normal heart sounds. No murmur heard. Pulmonary:     Effort: Pulmonary effort is normal. No tachypnea or respiratory distress.     Breath sounds: Normal breath sounds. No stridor. No decreased breath sounds, wheezing, rhonchi or rales.  Chest:     Chest wall: No tenderness or edema.  Abdominal:     General: Abdomen is flat. Bowel sounds are normal.     Palpations: Abdomen is soft.     Tenderness: There is no abdominal tenderness. There is no guarding.  Musculoskeletal:        General: Normal range of motion.     Cervical back: Normal range of motion and neck supple.     Right lower leg: No edema.     Left lower leg: No edema.  Skin:    General: Skin is warm and dry.     Findings: No rash.  Neurological:     General: No focal deficit present.     Mental Status: [name] alert and oriented to person, place, and time. Mental status is at baseline.  Psychiatric:        Mood and Affect: Mood normal.        Behavior: Behavior normal.        Thought Content: Thought content normal.        Judgment: Judgment normal.     ED Results / Procedures / Treatments   Labs (all labs ordered are listed, but only abnormal results are displayed) Labs Reviewed  RESP PANEL BY RT-PCR (RSV, FLU A&B, COVID)  [id number]  COMPREHENSIVE METABOLIC PANEL  BRAIN NATRIURETIC PEPTIDE  CBC WITH DIFFERENTIAL/PLATELET  TROPONIN I (HIGH SENSITIVITY)    EKG EKG Interpretation Date/Time:  Thursday August 20 2023 11:17:18 EST Ventricular Rate:  105 PR Interval:  164 QRS Duration:  84 QT Interval:  340 QTC Calculation: 449 R Axis:   85  Text Interpretation: Sinus tachycardia with Premature supraventricular  complexes and with occasional Premature ventricular complexes Poor data quality, interpretation may be adversely affected Confirmed by Pricilla Loveless [phone]) on 08/20/2023 11:22:12 AM  Radiology No results found.  Procedures Procedures    Medications Ordered in ED Medications  nitroGLYCERIN (NITROSTAT) SL tablet 0.4 mg (has no administration in time range)  morphine (PF) 4 MG/ML injection 4 mg (has no administration in time range)  ondansetron (ZOFRAN) injection 4 mg (has no administration in time range)  alum & mag hydroxide-simeth (MAALOX/MYLANTA) 200-200-20 MG/5ML suspension 30 mL (  has no administration in time range)    ED Course/ Medical Decision Making/ A&P                                 Medical Decision Making This patient presents to the ED for concern of chest pain, this involves an extensive number of treatment options, and is a complaint that carries with it a high risk of complications and morbidity.  The differential diagnosis includes ACS, PE, aortic aneurysm/dissection, pericarditis, myocarditis, pneumonia, acute viral syndrome   Co morbidities that complicate the patient evaluation  Hypertension, tobacco use   Additional history obtained:  Additional history obtained from medical records External records from outside source obtained and reviewed including none   Lab Tests:  I Ordered, and personally interpreted labs.  The pertinent results include: Normal troponin, electrolytes, no leukocytosis   Imaging Studies ordered:  I ordered imaging studies including chest x-ray, CTA chest I independently visualized and interpreted imaging which showed no pulmonary embolus, coronary artery calcification I agree with the radiologist interpretation   Cardiac Monitoring: / EKG:  The patient was maintained on a cardiac monitor.  I personally viewed and interpreted the cardiac monitored which showed an underlying rhythm of: Normal sinus rhythm, T wave inversions in  V2 and V3, no STEMI   Consultations Obtained:  I requested consultation with the cardiology,  and discussed lab and imaging findings as well as pertinent plan - they recommend: Admission   Problem List / ED Course / Critical interventions / Medication management  Patient does remain stable at this time.  Chest pain has improved with treatment in the emergency department but he does continue to note ongoing pain at this time.  Did discuss patient case with Dr. Wyline Mood with cardiology who is in agreement to plan for admission at this time.  Patient has been given dose of aspirin in the emergency department.  Troponin is within normal limits at this time and do not suspect that heparin is warranted.  EKG demonstrated no STEMI but does have some new T wave inversions.  CT of the chest demonstrated no indication for pulmonary embolus but does demonstrate coronary artery calcifications.  Did discuss patient case with hospitalist Dr. Rennis Chris who has admitted at this time. I ordered medication including aspirin, morphine, nitro for chest pain Reevaluation of the patient after these medicines showed that the patient improved I have reviewed the patients home medicines and have made adjustments as needed   Social Determinants of Health:  None   Test / Admission - Considered:  Admission    Amount and/or Complexity of Data Reviewed Labs: ordered. Radiology: ordered.  Risk OTC drugs. Prescription drug management. Decision regarding hospitalization.           Final Clinical Impression(s) / ED Diagnoses Final diagnoses:  None    Rx / DC Orders ED Discharge Orders     None         Lelon Perla, PA-C 08/20/23 1438    Pricilla Loveless, MD 08/21/23 [phone]

## 2023-08-20 NOTE — Discharge Summary (Signed)
Physician Discharge Summary   Patient: Vincent Hudson MRN: [id number] DOB: 1955-09-18  Admit date:     08/20/2023  Discharge date: AGAINST MEDICAL ADVISE  Discharge Physician: Debarah Crape   PCP: Kara Pacer, NP   Recommendations at discharge:    Left AMA   Discharge Diagnoses: Principal Problem:   Chest pain  Resolved Problems:   * No resolved hospital problems. *  Hospital Course: Vincent Hudson is a 68 y.o. male with medical history significant of lumbar radiculopathy, chronic pain, HTN, COPD, BPH, GERD.  He presents with left sided chest pain that began around 8am when trying to get out of bed. Has been getting progressively worse today, with associated shortness of breath ,left neck and left arm pain. Endorses occasional palpitations today too. Believes he may be missing beats. Denies nausea, vomiting or fever today. Denies taking his BP at home today. He reports he has had chest pain ongoing for the last year, he has not seen anyone for this.  Endorses 5 years of 3 pillow orthopnea. Reports he has had "3 mild heart attacks" and stress echos in the past but reports no need for stent before. Reports last cardiac work up was 15 years ago, and he cannot recall anything about those heart attacks. He has constant shortness of breath, uses two daily inhalers. Currently smokes 1 pack a day, previously as much as two packs a day. Has been smoking for >50years. Trying to cut back now.   On arrival to the ED He was found to be hemodynamically stable, blood pressure 133/67, mildly tachypneic and desatted to 89% on room air. He improved with 2L O2. CBC and CMP are grossly unremarkable.  High-sensitivity troponin 12, BNP 86.  Respiratory viral panel negative.  CTA without evidence of PE, coronary artery calcifications are noted throughout.  Chest x-ray was unremarkable.  EKG revealed some new T wave inversions.  Cardiology was consulted from the ED.  Patient was admitted for  observation, with plans to pursue echocardiogram, further lab work, and possible stress test.  Not long after my evaluation echo tech arrived to perform the echocardiogram and the patient refused to have it done.  He then left AGAINST MEDICAL ADVICE before I was able to speak with him again.       Consultants: Cardiology Procedures performed: n/a  Disposition: Home Diet recommendation:  Cardiac diet DISCHARGE MEDICATION: Med Rec Not Done as patient left AMA  Discharge Exam: Filed Weights   08/20/23 1116  Weight: 97.5 kg   See H&P same date  Condition at discharge:  Unstable. Left AMA  The results of significant diagnostics from this hospitalization (including imaging, microbiology, ancillary and laboratory) are listed below for reference.   Imaging Studies: CT Angio Chest PE W/Cm &/Or Wo Cm Result Date: 08/20/2023 CLINICAL DATA:  Chest pain.  Shortness of breath. EXAM: CT ANGIOGRAPHY CHEST WITH CONTRAST TECHNIQUE: Multidetector CT imaging of the chest was performed using the standard protocol during bolus administration of intravenous contrast. Multiplanar CT image reconstructions and MIPs were obtained to evaluate the vascular anatomy. RADIATION DOSE REDUCTION: This exam was performed according to the departmental dose-optimization program which includes automated exposure control, adjustment of the mA and/or kV according to patient size and/or use of iterative reconstruction technique. CONTRAST:  OMNIPAQUE IOHEXOL 350 MG/ML SOLN COMPARISON:  February 25, 2023. FINDINGS: Cardiovascular: Satisfactory opacification of the pulmonary arteries to the segmental level. No evidence of pulmonary embolism. Normal heart size. No pericardial effusion. Coronary artery  calcifications are noted. Mediastinum/Nodes: No enlarged mediastinal, hilar, or axillary lymph nodes. Thyroid gland, trachea, and esophagus demonstrate no significant findings. Lungs/Pleura: No pneumothorax or pleural effusion is  noted. No acute pulmonary disease is noted. Upper Abdomen: No acute abnormality. Musculoskeletal: No chest wall abnormality. No acute or significant osseous findings. Review of the MIP images confirms the above findings. IMPRESSION: No definite evidence of pulmonary embolus. Coronary artery calcifications are noted suggesting coronary artery disease. Aortic Atherosclerosis (ICD10-I70.0). Electronically Signed   By: Lupita Raider M.D.   On: 08/20/2023 13:32   DG Chest Port 1 View Result Date: 08/20/2023 CLINICAL DATA:  Chest pain. EXAM: PORTABLE CHEST 1 VIEW COMPARISON:  Chest radiograph dated 07/15/2022. CT chest dated 02/25/2023. FINDINGS: The heart size and mediastinal contours are within normal limits. Aortic atherosclerosis. Mild hyperinflation. No focal consolidation, pleural effusion, or pneumothorax. Multiple remote healed left rib fractures. No acute osseous abnormality. IMPRESSION: No acute cardiopulmonary findings. Electronically Signed   By: Hart Robinsons M.D.   On: 08/20/2023 11:49    Microbiology: Results for orders placed or performed during the hospital encounter of 08/20/23  Resp panel by RT-PCR (RSV, Flu A&B, Covid) Anterior Nasal Swab     Status: None   Collection Time: 08/20/23 11:42 AM   Specimen: Anterior Nasal Swab  Result Value Ref Range Status   SARS Coronavirus 2 by RT PCR NEGATIVE NEGATIVE Final    Comment: (NOTE) SARS-CoV-2 target nucleic acids are NOT DETECTED.  The SARS-CoV-2 RNA is generally detectable in upper respiratory specimens during the acute phase of infection. The lowest concentration of SARS-CoV-2 viral copies this assay can detect is 138 copies/mL. A negative result does not preclude SARS-Cov-2 infection and should not be used as the sole basis for treatment or other patient management decisions. A negative result may occur with  improper specimen collection/handling, submission of specimen other than nasopharyngeal swab, presence of viral  mutation(s) within the areas targeted by this assay, and inadequate number of viral copies(<138 copies/mL). A negative result must be combined with clinical observations, patient history, and epidemiological information. The expected result is Negative.  Fact Sheet for Patients:  BloggerCourse.com  Fact Sheet for Healthcare Providers:  SeriousBroker.it  This test is no t yet approved or cleared by the [place] FDA and  has been authorized for detection and/or diagnosis of SARS-CoV-2 by FDA under an Emergency Use Authorization (EUA). This EUA will remain  in effect (meaning this test can be used) for the duration of the COVID-19 declaration under Section 564(b)(1) of the Act, 21 U.S.C.section 360bbb-3(b)(1), unless the authorization is terminated  or revoked sooner.       Influenza A by PCR NEGATIVE NEGATIVE Final   Influenza B by PCR NEGATIVE NEGATIVE Final    Comment: (NOTE) The Xpert Xpress SARS-CoV-2/FLU/RSV plus assay is intended as an aid in the diagnosis of influenza from Nasopharyngeal swab specimens and should not be used as a sole basis for treatment. Nasal washings and aspirates are unacceptable for Xpert Xpress SARS-CoV-2/FLU/RSV testing.  Fact Sheet for Patients: BloggerCourse.com  Fact Sheet for Healthcare Providers: SeriousBroker.it  This test is not yet approved or cleared by the [place] FDA and has been authorized for detection and/or diagnosis of SARS-CoV-2 by FDA under an Emergency Use Authorization (EUA). This EUA will remain in effect (meaning this test can be used) for the duration of the COVID-19 declaration under Section 564(b)(1) of the Act, 21 U.S.C. section 360bbb-3(b)(1), unless the authorization is terminated or revoked.  Resp Syncytial Virus by PCR NEGATIVE NEGATIVE Final    Comment: (NOTE) Fact Sheet for  Patients: BloggerCourse.com  Fact Sheet for Healthcare Providers: SeriousBroker.it  This test is not yet approved or cleared by the [place] FDA and has been authorized for detection and/or diagnosis of SARS-CoV-2 by FDA under an Emergency Use Authorization (EUA). This EUA will remain in effect (meaning this test can be used) for the duration of the COVID-19 declaration under Section 564(b)(1) of the Act, 21 U.S.C. section 360bbb-3(b)(1), unless the authorization is terminated or revoked.  Performed at [facility], [address], [place], [place] [address]     Labs: CBC: Recent Labs  Lab 08/20/23 1142  WBC 10.1  NEUTROABS 7.3  HGB 14.9  HCT 43.7  MCV 96.9  PLT 278   Basic Metabolic Panel: Recent Labs  Lab 08/20/23 1142  NA 143  K 3.9  CL 105  CO2 28  GLUCOSE 141*  BUN 13  CREATININE 1.06  CALCIUM 9.2   Liver Function Tests: Recent Labs  Lab 08/20/23 1142  AST 27  ALT 31  ALKPHOS 70  BILITOT 0.7  PROT 7.1  ALBUMIN 3.7   CBG: No results for input(s): "GLUCAP" in the last 168 hours.  Discharge time spent: greater than 30 minutes.  Signed: Debarah Crape, DO [facility] 08/20/2023

## 2023-08-20 NOTE — ED Triage Notes (Signed)
Pt reports he woke up with "real bad chest pain." Pain radiates into the left side of his neck and left arm Pt reports shortness of breath. Pt reports a hx of "mild heart attacks."

## 2023-08-20 NOTE — Progress Notes (Signed)
Attempted to do echo but patients states he is hungry and wants his I.V. out. Nurse there. Asked patient 3 times about letting me do his echo but refused.       Celesta Gentile, RCS

## 2023-08-20 NOTE — H&P (Signed)
History and Physical    Patient: Vincent Hudson:[id number] DOB: 01-13-1956 DOA: 08/20/2023 DOS: the patient was seen and examined on 08/20/2023 PCP: Kara Pacer, NP  Patient coming from: Home  Chief Complaint:  Chief Complaint  Patient presents with   Chest Pain   Shortness of Breath   HPI: Vincent Hudson is a 68 y.o. male with medical history significant of lumbar radiculopathy, chronic pain, HTN, COPD, BPH, GERD.  He presents with left sided chest pain that began around 8am when trying to get out of bed. Has been getting progressively worse today, with associated shortness of breath ,left neck and left arm pain. Endorses occasional palpitations today too. Believes he may be missing beats. Denies nausea, vomiting or fever today. Denies taking his BP at home today. He reports he has had chest pain ongoing for the last year, he has not seen anyone for this.  Endorses 5 years of 3 pillow orthopnea. Reports he has had "3 mild heart attacks" and stress echos in the past but reports no need for stent before. Reports last cardiac work up was 15 years ago, and he cannot recall anything about those heart attacks. He has constant shortness of breath, uses two daily inhalers. Currently smokes 1 pack a day, previously as much as two packs a day. Has been smoking for >50years. Trying to cut back now.  On arrival to the ED He was found to be hemodynamically stable, blood pressure 133/67, mildly tachypneic and desatted to 89% on room air. He improved with 2L O2. CBC and CMP are grossly unremarkable.  High-sensitivity troponin 12, BNP 86.  Respiratory viral panel negative.  CTA without evidence of PE, coronary artery calcifications are noted throughout.  Chest x-ray was unremarkable.  EKG revealed some new T wave inversions.  Cardiology was consulted from the ED   Review of Systems: As mentioned in the history of present illness. All other systems reviewed and are negative. Past  Medical History:  Diagnosis Date   Anxiety    Arthritis    Asthma    Chronic back pain    Degenerative disc disease, cervical    and lumbar   GERD (gastroesophageal reflux disease)    History of stomach ulcers    Myocardial infarction (HCC) 2006   PUD (peptic ulcer disease)    bleeding; per patient multiple times in his past.   Past Surgical History:  Procedure Laterality Date   ABDOMINAL SURGERY     "for ulcers"   BIOPSY N/A 11/21/2014   Procedure: BIOPSY;  Surgeon: West Bali, MD;  Location: [facility];  Service: Endoscopy;  Laterality: N/A;   CHOLECYSTECTOMY     COLONOSCOPY WITH PROPOFOL N/A 11/21/2014   WJX:BJYN colon polyps removed/left colon is redundant/large internal hemorrhoids   ESOPHAGOGASTRODUODENOSCOPY (EGD) WITH PROPOFOL N/A 11/21/2014   WGN:FAOZ non erosive gastritis/dyspepsiadue to IBS   LEG SURGERY Left    "drew fluid out of my leg for infectious arthritis".   POLYPECTOMY N/A 11/21/2014   Procedure: POLYPECTOMY;  Surgeon: West Bali, MD;  Location: [facility];  Service: Endoscopy;  Laterality: N/A;   Social History:  reports that he has been smoking cigarettes. He has a 30 pack-year smoking history. He has never used smokeless tobacco. He reports current alcohol use of about 12.0 standard drinks of alcohol per week. He reports that he does not use drugs.  Allergies  Allergen Reactions   Nexium [Esomeprazole] Swelling    Throat swelling   Penicillins Anaphylaxis  and Rash    immediate rash, facial/tongue/throat swelling, SOB or lightheadedness with hypotension severe rash involving mucus membranes or skin necrosis    Aspirin Other (See Comments)    Stomach bleeding   Nsaids Other (See Comments)    Stomach bleeding   Esomeprazole Magnesium Swelling and Rash    throat swelling   Penicillin G Rash and Other (See Comments)    Family History  Problem Relation Age of Onset   Colon cancer Maternal Uncle    Colon cancer Paternal Uncle     Prior to Admission  medications   Medication Sig Start Date End Date Taking? Authorizing Provider  albuterol (PROVENTIL HFA;VENTOLIN HFA) 108 (90 BASE) MCG/ACT inhaler Inhale 2 puffs into the lungs every 6 (six) hours as needed. For shortness of breath    [provider]  albuterol (PROVENTIL) (2.5 MG/3ML) 0.083% nebulizer solution Take 2.5 mg by nebulization every 6 (six) hours as needed for wheezing or shortness of breath.    [provider]  ALPRAZolam Prudy Feeler) 0.5 MG tablet Take 0.5 mg by mouth 3 (three) times daily as needed. For anxiety and nervous stomach    [provider]  BREZTRI AEROSPHERE 160-9-4.8 MCG/ACT AERO Inhale 2 puffs into the lungs 2 (two) times daily.    [provider]  clindamycin (CLEOCIN) 300 MG capsule Take 300 mg by mouth 3 (three) times daily. Patient not taking: Reported on 02/06/2022 01/06/22   [provider]  dicyclomine (BENTYL) 10 MG capsule 1 PO 30 MINUTES PRIOR TO BREAKFAST AND LUNCH to treat diarrhea and abdominal pain. 11/21/14   Fields, Darleene Cleaver, MD  finasteride (PROSCAR) 5 MG tablet Take 5 mg by mouth daily. 01/21/22   [provider]  gabapentin (NEURONTIN) 600 MG tablet Take 600 mg by mouth 4 (four) times daily.    [provider]  lisinopril-hydrochlorothiazide (ZESTORETIC) 20-12.5 MG tablet Take 1 tablet by mouth daily. 01/21/22   [provider]  metFORMIN (GLUCOPHAGE) 500 MG tablet Take 500 mg by mouth 2 (two) times daily.    [provider]  methocarbamol (ROBAXIN) 500 MG tablet Take 500 mg by mouth 2 (two) times daily as needed.    [provider]  mirabegron ER (MYRBETRIQ) 25 MG TB24 tablet Take 1 tablet (25 mg total) by mouth daily. 02/06/22   Stoneking, Danford Bad., MD  naproxen (NAPROSYN) 500 MG tablet Take 500 mg by mouth 2 (two) times daily. 11/24/19   [provider]  oxyCODONE-acetaminophen (PERCOCET) 10-325 MG tablet Take 1 tablet by mouth every 4 (four) hours as needed for  pain.    [provider]  pantoprazole (PROTONIX) 40 MG tablet Take 40 mg by mouth daily. 11/24/19   [provider]  tamsulosin (FLOMAX) 0.4 MG CAPS capsule Take 0.8 mg by mouth daily. 12/26/21   [provider]  TRELEGY ELLIPTA 100-62.5-25 MCG/INH AEPB Take 1 puff by mouth daily. 11/24/19   [provider]    Physical Exam: Vitals:   08/20/23 1116 08/20/23 1200 08/20/23 1215 08/20/23 1230  BP:  134/80 139/74 117/80  Pulse:  83 80 82  Resp:  (!) 23 19 15   Temp:      TempSrc:      SpO2:   97% 94%  Weight: 97.5 kg     Height: 5\' 11"  (1.803 m)      Constitutional:  Normal appearance. Non toxic-appearing.  HENT: Head Normocephalic and atraumatic.  Mucous membranes are moist.  Eyes:  Extraocular intact. Conjunctivae normal.  Pupils are equal, round, and reactive to light.  Cardiovascular: Rate and Rhythm: Normal rate and regular rhythm.  Pulmonary: Non labored, symmetric rise of chest wall.  Musculoskeletal:  Normal range of motion.  Skin: warm and dry. not jaundiced.  Neurological: No focal deficit present. alert. Oriented. Psychiatric: Mood and Affect congruent.    Data Reviewed:     Latest Ref Rng & Units 08/20/2023   11:42 AM 08/08/2015   11:45 AM 11/15/2014   11:40 AM  CBC  WBC 4.0 - 10.5 K/uL 10.1  15.2  9.5   Hemoglobin 13.0 - 17.0 g/dL 16.1  09.6  04.5   Hematocrit 39.0 - 52.0 % 43.7  43.5  41.8   Platelets 150 - 400 K/uL 278  291  260     Lab Results  Component Value Date   TROPONINI <0.30 02/13/2012      Latest Ref Rng & Units 08/20/2023   11:42 AM 08/08/2015   11:45 AM 11/15/2014   11:40 AM  BMP  Glucose 70 - 99 mg/dL 409  811  97   BUN 8 - 23 mg/dL 13  13  13    Creatinine 0.61 - 1.24 mg/dL 9.14  7.82  9.56   Sodium 135 - 145 mmol/L 143  138  139   Potassium 3.5 - 5.1 mmol/L 3.9  4.0  4.2   Chloride 98 - 111 mmol/L 105  105  103   CO2 22 - 32 mmol/L 28  25  29    Calcium 8.9 - 10.3 mg/dL 9.2  8.8  9.0      Assessment and  Plan:  Chest Pain - EKG with new T wave changes, Trop 12, will repeat. - ECHO pending - Heart score: 5, elevated for age, smoking history, hypertension, obesity. - Cardiology, Dr. Wyline Mood has been consulted.  Echocardiogram has been ordered.  Considering stress test per results of echocardiogram.   -- CAD as noted on CTA.  TSH pending - Hemoglobin A1c and lipid panel for restratification pending -- Monitor on tele - PRN pain meds  Chronic Pain - Sees pain clinic, resume home meds  Lumbar Radiculopathy - PT evals - Chronic pain meds as above  HTN - Resume home meds - PRN Hydralazine and labetolol  COPD, not in exacerbation - Does not use O2 at baseline - Continue supplemental O2 as needed, will wean as tolerated - DuoNebs as needed - Home dose inhalers  BPH - Resume home meds  Tobacco abuse - Currently smokes a pack per day, was previously smoking 2 packs/day x 50 years - Continue 21 mg nicotine patch for now.     Advance Care Planning: Patient reports he has an advanced directive and would like to proceed with DO NOT RESUSCITATE.  [name] amendable to intubation if in respiratory distress and there is meaningful hope at respiratory recovery.  Consults: Cardiology, Dr. Wyline Mood  Family Communication: none at bedside, Discussed with the patient and all questioned fully answered.   Severity of Illness: The appropriate patient status for this patient is OBSERVATION. Observation status is judged to be reasonable and necessary in order to provide the required intensity of service to ensure the patient's safety. The patient's presenting symptoms, physical exam findings, and initial radiographic and laboratory data in the context of their medical condition is felt to place them at decreased risk for further clinical deterioration. Furthermore, it is anticipated that the patient will be medically stable for discharge from the hospital within 2 midnights of admission.  Author: Debarah Crape, DO 08/20/2023 2:30 PM  For on call review [email or web address].

## 2023-11-09 ENCOUNTER — Other Ambulatory Visit: Payer: Self-pay

## 2023-11-09 ENCOUNTER — Encounter (HOSPITAL_COMMUNITY): Payer: Self-pay | Admitting: Emergency Medicine

## 2023-11-09 ENCOUNTER — Emergency Department (HOSPITAL_COMMUNITY)
Admission: EM | Admit: 2023-11-09 | Discharge: 2023-11-09 | Disposition: A | Discharge status: Home / Self Care | Attending: Emergency Medicine | Admitting: Emergency Medicine

## 2023-11-09 ENCOUNTER — Emergency Department (HOSPITAL_COMMUNITY)

## 2023-11-09 DIAGNOSIS — Z720 Tobacco use: Secondary | ICD-10-CM | POA: Diagnosis not present

## 2023-11-09 DIAGNOSIS — J45909 Unspecified asthma, uncomplicated: Secondary | ICD-10-CM | POA: Diagnosis not present

## 2023-11-09 DIAGNOSIS — R1013 Epigastric pain: Secondary | ICD-10-CM | POA: Insufficient documentation

## 2023-11-09 LAB — CBC WITH DIFFERENTIAL/PLATELET
Abs Immature Granulocytes: 0.04 10*3/uL (ref 0.00–0.07)
Basophils Absolute: 0 10*3/uL (ref 0.0–0.1)
Basophils Relative: 0 %
Eosinophils Absolute: 0.2 10*3/uL (ref 0.0–0.5)
Eosinophils Relative: 2 %
HCT: 43.7 % (ref 39.0–52.0)
Hemoglobin: 14.4 g/dL (ref 13.0–17.0)
Immature Granulocytes: 0 %
Lymphocytes Relative: 15 %
Lymphs Abs: 1.8 10*3/uL (ref 0.7–4.0)
MCH: 32.3 pg (ref 26.0–34.0)
MCHC: 33 g/dL (ref 30.0–36.0)
MCV: 98 fL (ref 80.0–100.0)
Monocytes Absolute: 0.8 10*3/uL (ref 0.1–1.0)
Monocytes Relative: 7 %
Neutro Abs: 8.7 10*3/uL — ABNORMAL HIGH (ref 1.7–7.7)
Neutrophils Relative %: 76 %
Platelets: 240 10*3/uL (ref 150–400)
RBC: 4.46 MIL/uL (ref 4.22–5.81)
RDW: 12.8 % (ref 11.5–15.5)
WBC: 11.6 10*3/uL — ABNORMAL HIGH (ref 4.0–10.5)
nRBC: 0 % (ref 0.0–0.2)

## 2023-11-09 LAB — URINALYSIS, ROUTINE W REFLEX MICROSCOPIC
Bilirubin Urine: NEGATIVE
Glucose, UA: NEGATIVE mg/dL
Hgb urine dipstick: NEGATIVE
Ketones, ur: NEGATIVE mg/dL
Leukocytes,Ua: NEGATIVE
Nitrite: NEGATIVE
Protein, ur: NEGATIVE mg/dL
Specific Gravity, Urine: 1.016 (ref 1.005–1.030)
pH: 7 (ref 5.0–8.0)

## 2023-11-09 LAB — LIPASE, BLOOD: Lipase: 32 U/L (ref 11–51)

## 2023-11-09 LAB — COMPREHENSIVE METABOLIC PANEL WITH GFR
ALT: 45 U/L — ABNORMAL HIGH (ref 0–44)
AST: 63 U/L — ABNORMAL HIGH (ref 15–41)
Albumin: 3.7 g/dL (ref 3.5–5.0)
Alkaline Phosphatase: 79 U/L (ref 38–126)
Anion gap: 8 (ref 5–15)
BUN: 11 mg/dL (ref 8–23)
CO2: 29 mmol/L (ref 22–32)
Calcium: 9.1 mg/dL (ref 8.9–10.3)
Chloride: 100 mmol/L (ref 98–111)
Creatinine, Ser: 0.79 mg/dL (ref 0.61–1.24)
GFR, Estimated: >60 mL/min (ref >60)
Glucose, Bld: 114 mg/dL — ABNORMAL HIGH (ref 70–99)
Potassium: 4 mmol/L (ref 3.5–5.1)
Sodium: 137 mmol/L (ref 135–145)
Total Bilirubin: 0.5 mg/dL (ref 0.0–1.2)
Total Protein: 7.4 g/dL (ref 6.5–8.1)

## 2023-11-09 MED ORDER — FAMOTIDINE IN NACL 20-0.9 MG/50ML-% IV SOLN: 20.0000 mg | Freq: Once | INTRAVENOUS | Status: DC

## 2023-11-09 MED ORDER — ALUM & MAG HYDROXIDE-SIMETH 200-200-20 MG/5ML PO SUSP
30.0000 mL | Freq: Once | ORAL | Status: AC
  Administered 2023-11-09: 30 mL via ORAL
  Filled 2023-11-09: qty 30

## 2023-11-09 MED ORDER — HYDROMORPHONE HCL 1 MG/ML IJ SOLN
1.0000 mg | Freq: Once | INTRAMUSCULAR | Status: AC
  Administered 2023-11-09: 1 mg via INTRAVENOUS
  Filled 2023-11-09: qty 1

## 2023-11-09 MED ORDER — ONDANSETRON HCL 4 MG/2ML IJ SOLN
4.0000 mg | Freq: Once | INTRAMUSCULAR | Status: AC
  Administered 2023-11-09: 4 mg via INTRAVENOUS
  Filled 2023-11-09: qty 2

## 2023-11-09 MED ORDER — IOHEXOL 300 MG/ML  SOLN
100.0000 mL | Freq: Once | INTRAMUSCULAR | Status: AC | PRN
  Administered 2023-11-09: 100 mL via INTRAVENOUS

## 2023-11-09 MED ORDER — LIDOCAINE VISCOUS HCL 2 % MT SOLN
15.0000 mL | Freq: Once | OROMUCOSAL | Status: AC
  Administered 2023-11-09: 15 mL via ORAL
  Filled 2023-11-09: qty 15

## 2023-11-09 NOTE — ED Notes (Signed)
 Pt stated to this nurse, "take this thing out my arm or I'll do it myself. I am ready to go."  This nurse was able to get pt to take PO meds but refused IV medication stated, "Pepcid  isn't worth a damn.". Pt signed AMA form.

## 2023-11-09 NOTE — ED Provider Notes (Signed)
 [place] [facility] DEPARTMENT AT [facility] Provider Note   CSN: [id number] Arrival date & time: 11/09/23  1711     History  Chief Complaint  Patient presents with   Abdominal Pain    Vincent Hudson is a 68 y.o. male with PMH as listed below who presents with right sided abdominal pain since noon.  Acute onset.  Feels like the last 3 times he had a gastric ulcer perforation and he was bleeding internally.  Nausea w/ no vomiting.  No fever/chills, urinary symptoms, diarrhea constipation, melena or hematochezia. Drinks 3 beers per week. Smokes tobacco. Takes protonix at home.    Past Medical History:  Diagnosis Date   Anxiety    Arthritis    Asthma    Chronic back pain    Degenerative disc disease, cervical    and lumbar   GERD (gastroesophageal reflux disease)    History of stomach ulcers    Myocardial infarction (HCC) 2006   PUD (peptic ulcer disease)    bleeding; per patient multiple times in his past.       Home Medications Prior to Admission medications   Medication Sig Start Date End Date Taking? Authorizing Provider  albuterol (PROVENTIL HFA;VENTOLIN HFA) 108 (90 BASE) MCG/ACT inhaler Inhale 2 puffs into the lungs every 6 (six) hours as needed. For shortness of breath    [provider]  albuterol (PROVENTIL) (2.5 MG/3ML) 0.083% nebulizer solution Take 2.5 mg by nebulization every 6 (six) hours as needed for wheezing or shortness of breath.    [provider]  ALPRAZolam (XANAX) 0.5 MG tablet Take 0.5 mg by mouth 3 (three) times daily as needed. For anxiety and nervous stomach    [provider]  BREZTRI AEROSPHERE 160-9-4.8 MCG/ACT AERO Inhale 2 puffs into the lungs 2 (two) times daily.    [provider]  clindamycin (CLEOCIN) 300 MG capsule Take 300 mg by mouth 3 (three) times daily. Patient not taking: Reported on 02/06/2022 01/06/22   [provider]  dicyclomine  (BENTYL ) 10 MG capsule 1 PO 30 MINUTES  PRIOR TO BREAKFAST AND LUNCH to treat diarrhea and abdominal pain. 11/21/14   Fields, Sandi L, MD  finasteride (PROSCAR) 5 MG tablet Take 5 mg by mouth daily. 01/21/22   [provider]  gabapentin (NEURONTIN) 600 MG tablet Take 600 mg by mouth 4 (four) times daily.    [provider]  lisinopril-hydrochlorothiazide (ZESTORETIC) 20-12.5 MG tablet Take 1 tablet by mouth daily. 01/21/22   [provider]  metFORMIN (GLUCOPHAGE) 500 MG tablet Take 500 mg by mouth 2 (two) times daily.    [provider]  methocarbamol (ROBAXIN) 500 MG tablet Take 500 mg by mouth 2 (two) times daily as needed.    [provider]  mirabegron  ER (MYRBETRIQ ) 25 MG TB24 tablet Take 1 tablet (25 mg total) by mouth daily. 02/06/22   Stoneking, Ponce Brisker., MD  naproxen (NAPROSYN) 500 MG tablet Take 500 mg by mouth 2 (two) times daily. 11/24/19   [provider]  oxyCODONE -acetaminophen  (PERCOCET) 10-325 MG tablet Take 1 tablet by mouth every 4 (four) hours as needed for pain.    [provider]  pantoprazole (PROTONIX) 40 MG tablet Take 40 mg by mouth daily. 11/24/19   [provider]  tamsulosin (FLOMAX) 0.4 MG CAPS capsule Take 0.8 mg by mouth daily. 12/26/21   [provider]  TRELEGY ELLIPTA 100-62.5-25 MCG/INH AEPB Take 1 puff by mouth daily. 11/24/19   [provider]      Allergies    Nexium [esomeprazole], Penicillins, Aspirin , Nsaids, Esomeprazole magnesium, and Penicillin g    Review of Systems   Review of Systems A 10 point review of systems was performed and is negative unless otherwise reported in HPI.  Physical Exam Updated Vital Signs BP (!) 147/73   Pulse 80   Temp 97.6 F (36.4 C) (Oral)   Resp 18   Ht 5\' 11"  (1.803 m)   Wt 97.5 kg   SpO2 91%   BMI 29.98 kg/m  Physical Exam General: Uncomfortable appearing male, sitting in bed.  HEENT: Sclera anicteric, MMM, trachea midline.  Cardiology: RRR, no  murmurs/rubs/gallops.  Resp: Normal respiratory rate and effort. CTAB, no wheezes, rhonchi, crackles.  Abd: TTP in epigastric region, RUQ, RLQ with involuntary guarding.  GU: Deferred. MSK: No peripheral edema or signs of trauma. Extremities without deformity or TTP. Skin: warm, dry.  Back: No CVA tenderness Neuro: A&Ox4, CNs II-XII grossly intact. MAEs. Sensation grossly intact.  Psych: Normal mood and affect.   ED Results / Procedures / Treatments   Labs (all labs ordered are listed, but only abnormal results are displayed) Labs Reviewed  CBC WITH DIFFERENTIAL/PLATELET - Abnormal; Notable for the following components:      Result Value   WBC 11.6 (*)    Neutro Abs 8.7 (*)    All other components within normal limits  COMPREHENSIVE METABOLIC PANEL WITH GFR - Abnormal; Notable for the following components:   Glucose, Bld 114 (*)    AST 63 (*)    ALT 45 (*)    All other components within normal limits  LIPASE, BLOOD  URINALYSIS, ROUTINE W REFLEX MICROSCOPIC    EKG None  Radiology CT ABDOMEN PELVIS W CONTRAST Result Date: 11/09/2023 CLINICAL DATA:  Right-sided abdominal pain. EXAM: CT ABDOMEN AND PELVIS WITH CONTRAST TECHNIQUE: Multidetector CT imaging of the abdomen and pelvis was performed using the standard protocol following bolus administration of intravenous contrast. RADIATION DOSE REDUCTION: This exam was performed according to the departmental dose-optimization program which includes automated exposure control, adjustment of the mA and/or kV according to patient size and/or use of iterative reconstruction technique. CONTRAST:  OMNIPAQUE  IOHEXOL  300 MG/ML  SOLN COMPARISON:  August 08, 2015 FINDINGS: Lower chest: No acute abnormality. Hepatobiliary: A 5 mm diameter cyst versus hemangioma is seen within the posterior aspect of the right lobe of the liver. There is mild pneumobilia. Status post cholecystectomy. No biliary dilatation. Pancreas: Unremarkable. No pancreatic  ductal dilatation or surrounding inflammatory changes. Spleen: Normal in size without focal abnormality. Adrenals/Urinary Tract: An 11 mm low-attenuation right adrenal mass is seen (approximately 55.00 Hounsfield units). An 18 mm low-attenuation left adrenal mass is also noted (approximately 49.25 Hounsfield units). This is seen on the prior exam. Kidneys are normal, without renal calculi, focal lesion, or hydronephrosis. There is moderate severity diffuse urinary bladder wall thickening. Stomach/Bowel: Surgical sutures are seen within the gastric region. Appendix appears normal. No evidence of bowel wall thickening, distention, or inflammatory changes. Vascular/Lymphatic: Aortic atherosclerosis. No enlarged abdominal or pelvic lymph nodes. Reproductive: Prostate gland is mildly enlarged. Other: A 3.0 cm x 4.2 cm fat containing left inguinal hernia is seen. No abdominopelvic ascites. Musculoskeletal: Multilevel degenerative changes are noted throughout the lumbar spine. IMPRESSION: 1. Moderate severity diffuse urinary bladder wall thickening which may represent sequelae associated with cystitis. Correlation with urinalysis is recommended. 2. Bilateral low-attenuation adrenal masses, likely consistent with adrenal adenomas. Correlation with nonemergent adrenal protocol  CT is recommended. This recommendation follows ACR consensus guidelines: Management of Incidental Adrenal Masses: A White Paper of the ACR Incidental Findings Committee. J Am Coll Radiol 2017;14:1038-1044. 3. 3.0 cm x 4.2 cm fat containing left inguinal hernia. 4. Aortic atherosclerosis. Electronically Signed   By: Virgle Grime M.D.   On: 11/09/2023 19:27    Procedures Procedures    Medications Ordered in ED Medications  famotidine  (PEPCID ) IVPB 20 mg premix (20 mg Intravenous Patient Refused/Not Given 11/09/23 1950)  HYDROmorphone  (DILAUDID ) injection 1 mg (1 mg Intravenous Given 11/09/23 1808)  ondansetron  (ZOFRAN ) injection 4 mg (4 mg  Intravenous Given 11/09/23 1807)  iohexol  (OMNIPAQUE ) 300 MG/ML solution 100 mL (100 mLs Intravenous Contrast Given 11/09/23 1829)  alum & mag hydroxide-simeth (MAALOX/MYLANTA) 200-200-20 MG/5ML suspension 30 mL (30 mLs Oral Given 11/09/23 1949)    And  lidocaine  (XYLOCAINE ) 2 % viscous mouth solution 15 mL (15 mLs Oral Given 11/09/23 1950)    ED Course/ Medical Decision Making/ A&P                          Medical Decision Making Amount and/or Complexity of Data Reviewed Labs: ordered. Decision-making details documented in ED Course. Radiology: ordered. Decision-making details documented in ED Course.  Risk OTC drugs. Prescription drug management.    This patient presents to the ED for concern of abd pain, this involves an extensive number of treatment options, and is a complaint that carries with it a high risk of complications and morbidity.  I considered the following differential and admission for this acute, potentially life threatening condition. HDS. Afebrile.  MDM:    For DDX for abdominal pain includes but is not limited to:  Abdominal exam with peritoneal signs. Consider perforated gastric ulcer, GERD/gastritis/PUD, acute pancreatitis, or acute appendicitis. Has h/o cholecystectomy, lower c/f hepatobiliary disease. Also consider vascular catastrophe. Lower c/f bowel obstruction, diverticulitis. Will give pain medicine, antiemetics, obtain CT scan.    Clinical Course as of 11/09/23 1958  Mon Nov 09, 2023  1803 WBC(!): 11.6 +leukocytosis w/ left shift [HN]  1910 Lipase: 32 neg [HN]  1910 Comprehensive metabolic panel(!) Very mild transaminitis, AST>ALT, otherwise unremarkable [HN]  1911 Urinalysis, Routine w reflex microscopic -Urine, Clean Catch neg [HN]  1931 CT ABDOMEN PELVIS W CONTRAST 1. Moderate severity diffuse urinary bladder wall thickening which may represent sequelae associated with cystitis. Correlation with urinalysis is recommended. 2. Bilateral  low-attenuation adrenal masses, likely consistent with adrenal adenomas. Correlation with nonemergent adrenal protocol CT is recommended.   [HN]  1934 CT neg for acute process. Diffuse bladder wall thickening but UA neg for UTI. Will possibly have patient follow up with urology for that finding. Will give pepcid  and GI cocktail in the meantime for possible gastritis pain. [HN]  1955 Left AMA by BS RN because he wanted to eat [HN]    Clinical Course User Index [HN] Merdis Stalling, MD    Labs: I Ordered, and personally interpreted labs.  The pertinent results include: Those listed above  Imaging Studies ordered: I ordered imaging studies including CT abdomen pelvis with contrast I independently visualized and interpreted imaging. I agree with the radiologist interpretation  Additional history obtained from chart review.   Cardiac Monitoring: The patient was maintained on a cardiac monitor.  I personally viewed and interpreted the cardiac monitored which showed an underlying rhythm of: Normal sinus rhythm  Reevaluation: After the interventions noted above, I reevaluated the patient and found that  they have :improved  Social Determinants of Health:  lives independently  Disposition:  AMA with RN  Co morbidities that complicate the patient evaluation  Past Medical History:  Diagnosis Date   Anxiety    Arthritis    Asthma    Chronic back pain    Degenerative disc disease, cervical    and lumbar   GERD (gastroesophageal reflux disease)    History of stomach ulcers    Myocardial infarction (HCC) 2006   PUD (peptic ulcer disease)    bleeding; per patient multiple times in his past.     Medicines Meds ordered this encounter  Medications   HYDROmorphone  (DILAUDID ) injection 1 mg   ondansetron  (ZOFRAN ) injection 4 mg   iohexol  (OMNIPAQUE ) 300 MG/ML solution 100 mL   AND Linked Order Group    alum & mag hydroxide-simeth (MAALOX/MYLANTA) 200-200-20 MG/5ML suspension 30 mL     lidocaine  (XYLOCAINE ) 2 % viscous mouth solution 15 mL   famotidine  (PEPCID ) IVPB 20 mg premix    I have reviewed the patients home medicines and have made adjustments as needed  Problem List / ED Course: Problem List Items Addressed This Visit       Other   Abdominal pain - Primary                This note was created using dictation software, which may contain spelling or grammatical errors.    Merdis Stalling, MD 11/09/23 1958

## 2023-11-09 NOTE — ED Triage Notes (Signed)
 Pt c/o right abd pain since noon. Pt denies any n/v/d.

## 2023-11-09 NOTE — ED Notes (Signed)
 Pt asked for food and water . This nurse informed pt CT has to result first.

## 2023-11-09 NOTE — ED Notes (Signed)
 Patient standing in doorway of room verbalizing that he wants to leave because he has been here a long time and has not been allowed to eat. Made patient aware that I would reach out to Dr.  Patient does not want to speak to anyone. "I just want my IV out so I can go. EDP made aware, RN Peterson Brandt made aware as well.

## 2023-12-02 ENCOUNTER — Encounter (INDEPENDENT_AMBULATORY_CARE_PROVIDER_SITE_OTHER): Payer: Self-pay | Admitting: *Deleted

## 2023-12-04 ENCOUNTER — Encounter: Payer: Self-pay | Admitting: *Deleted
